# Patient Record
Sex: Female | Born: 1955 | Race: White | Hispanic: No | Marital: Married | Smoking: Never smoker
Health system: Southern US, Community
[De-identification: ages and names within clinical notes are randomized; demographics above are authoritative.]

## PROBLEM LIST (undated history)

## (undated) HISTORY — PX: TONSILECTOMY/ADENOIDECTOMY WITH MYRINGOTOMY: SHX6125

## (undated) HISTORY — PX: TONSILLECTOMY: SUR1361

---

## 1998-06-13 HISTORY — PX: APPENDECTOMY: SHX54

## 2003-06-14 HISTORY — PX: TENNIS ELBOW RELEASE/NIRSCHEL PROCEDURE: SHX6651

## 2017-12-20 DIAGNOSIS — H43812 Vitreous degeneration, left eye: Secondary | ICD-10-CM | POA: Insufficient documentation

## 2018-08-27 ENCOUNTER — Ambulatory Visit: Payer: Self-pay | Admitting: Osteopathic Medicine

## 2018-10-22 ENCOUNTER — Encounter: Payer: Self-pay | Admitting: Osteopathic Medicine

## 2018-10-22 ENCOUNTER — Ambulatory Visit (INDEPENDENT_AMBULATORY_CARE_PROVIDER_SITE_OTHER): Payer: 59 | Admitting: Osteopathic Medicine

## 2018-10-22 VITALS — Temp 96.6°F | Wt 197.0 lb

## 2018-10-22 DIAGNOSIS — Z23 Encounter for immunization: Secondary | ICD-10-CM

## 2018-10-22 DIAGNOSIS — Z1211 Encounter for screening for malignant neoplasm of colon: Secondary | ICD-10-CM

## 2018-10-22 DIAGNOSIS — Z1239 Encounter for other screening for malignant neoplasm of breast: Secondary | ICD-10-CM

## 2018-10-22 DIAGNOSIS — Z7689 Persons encountering health services in other specified circumstances: Secondary | ICD-10-CM | POA: Diagnosis not present

## 2018-10-22 NOTE — Progress Notes (Signed)
Virtual Visit via Video (App used: Doximity) Note  I connected with      Cynthia Dalton on 10/22/18 at 11:10 by a telemedicine application and verified that I am speaking with the correct person using two identifiers.  Patient is at home I am working from home    I discussed the limitations of evaluation and management by telemedicine and the availability of in person appointments. The patient expressed understanding and agreed to proceed.  History of Present Illness: Cynthia Dalton is a 63 y.o. female who would like to discuss  Chief Complaint  Patient presents with  . Establish Care   [place]  In pretty good health, no problems Visit w/ PCP about a year ago Doing a [facility] on alzheimer's given her family history, labs were done Jan/Feb, pt reports no concerns were brought to her attention as far as the blood work, but she does not have records.    Colonoscopy at 49 and 59, planning to f/u in 5 years, next year. Needs Mammo.  No hx abn mammo or Pap      Immunization History  Administered Date(s) Administered  . Influenza,inj,Quad PF,6+ Mos 04/05/2018            Observations/Objective: Temp (!) 96.6 F (35.9 C)   Wt 197 lb (89.4 kg)  BP Readings from Last 3 Encounters:  No data found for BP   Exam: Normal Speech.  NAD  Lab and Radiology Results No results found for this or any previous visit (from the past 72 hour(s)). No results found.     Assessment and Plan: 63 y.o. female with The primary encounter diagnosis was Establishing care with new doctor, encounter for. Diagnoses of Breast cancer screening, Colon cancer screening, and Need for shingles vaccine were also pertinent to this visit.   PDMP not reviewed this encounter.   Orders Placed This Encounter  Procedures  . MM 3D SCREEN BREAST BILATERAL    Order Specific Question:   Reason for Exam (SYMPTOM  OR DIAGNOSIS REQUIRED)    Answer:    screening    Order Specific Question:   [facility] imaging location?    [facility]   No orders of the defined types were placed in this encounter.  There are no Patient Instructions on file for this visit.  Instructions sent via MyChart. If MyChart not available, pt was given option for info via personal e-mail w/ no guarantee of protected health info over unsecured e-mail communication, and MyChart sign-up instructions were included.   Follow Up Instructions: Return in about 6 months (around 04/24/2019) for annual / Pap, sooner if needed .    I discussed the assessment and treatment plan with the patient. The patient was provided an opportunity to ask questions and all were answered. The patient agreed with the plan and demonstrated an understanding of the instructions.   The patient was advised to call back or seek an in-person evaluation if any new concerns, if symptoms worsen or if the condition fails to improve as anticipated.  30 minutes of non-face-to-face time was provided during this encounter.                      Historical information moved to improve visibility of documentation.  History reviewed. No pertinent past medical history. Past Surgical History:  Procedure Laterality Date  . APPENDECTOMY  2000  . TENNIS ELBOW RELEASE/NIRSCHEL PROCEDURE  2005  .  TONSILECTOMY/ADENOIDECTOMY WITH MYRINGOTOMY     Social History   Tobacco Use  . Smoking status: Never Smoker  . Smokeless tobacco: Never Used  Substance Use Topics  . Alcohol use: Yes    Alcohol/week: 5.0 - 8.0 standard drinks    Types: 5 - 8 Standard drinks or equivalent per week   family history includes Alzheimer's disease in her father; Dementia in her mother; High Cholesterol in her mother; High blood pressure in her mother.  Medications: Current Outpatient Medications  Medication Sig Dispense Refill  . ibuprofen (ADVIL) 200 MG tablet Take 200 mg by mouth every 6  (six) hours as needed.     No current facility-administered medications for this visit.    No Known Allergies  PDMP not reviewed this encounter. Orders Placed This Encounter  Procedures  . MM 3D SCREEN BREAST BILATERAL    Order Specific Question:   Reason for Exam (SYMPTOM  OR DIAGNOSIS REQUIRED)    Answer:   screening    Order Specific Question:   [facility]?    [facility]   No orders of the defined types were placed in this encounter.

## 2018-10-29 ENCOUNTER — Ambulatory Visit (INDEPENDENT_AMBULATORY_CARE_PROVIDER_SITE_OTHER): Payer: 59 | Admitting: Family Medicine

## 2018-10-29 VITALS — BP 144/73 | HR 66 | Temp 98.1°F

## 2018-10-29 DIAGNOSIS — Z23 Encounter for immunization: Secondary | ICD-10-CM | POA: Diagnosis not present

## 2018-10-29 NOTE — Progress Notes (Signed)
1st shingrix vaccination given in left Deltoid.  Pt tolerated well and without complications.  Advised to det up appointment for 2 months.

## 2018-11-01 ENCOUNTER — Ambulatory Visit (INDEPENDENT_AMBULATORY_CARE_PROVIDER_SITE_OTHER): Payer: 59

## 2018-11-01 ENCOUNTER — Other Ambulatory Visit: Payer: Self-pay

## 2018-11-01 DIAGNOSIS — Z1239 Encounter for other screening for malignant neoplasm of breast: Secondary | ICD-10-CM | POA: Diagnosis not present

## 2018-12-31 ENCOUNTER — Ambulatory Visit: Payer: 59

## 2019-01-03 ENCOUNTER — Other Ambulatory Visit: Payer: Self-pay

## 2019-01-03 ENCOUNTER — Ambulatory Visit (INDEPENDENT_AMBULATORY_CARE_PROVIDER_SITE_OTHER): Payer: 59 | Admitting: Osteopathic Medicine

## 2019-01-03 VITALS — BP 141/71 | HR 69 | Temp 98.6°F | Wt 199.0 lb

## 2019-01-03 DIAGNOSIS — Z23 Encounter for immunization: Secondary | ICD-10-CM | POA: Diagnosis not present

## 2019-01-03 NOTE — Progress Notes (Signed)
Pt in today for shingrix vaccine. This is 2 of 2 of the shingrix series. Vitals taken and no fever noted. Vaccine was given in left deltoid. Pt tolerated well with no immediate complications.  

## 2019-04-02 ENCOUNTER — Ambulatory Visit (INDEPENDENT_AMBULATORY_CARE_PROVIDER_SITE_OTHER): Payer: Managed Care, Other (non HMO) | Admitting: Osteopathic Medicine

## 2019-04-02 ENCOUNTER — Other Ambulatory Visit: Payer: Self-pay

## 2019-04-02 DIAGNOSIS — Z23 Encounter for immunization: Secondary | ICD-10-CM

## 2019-05-26 ENCOUNTER — Encounter: Payer: Self-pay | Admitting: Emergency Medicine

## 2019-05-26 ENCOUNTER — Emergency Department (INDEPENDENT_AMBULATORY_CARE_PROVIDER_SITE_OTHER)
Admission: EM | Admit: 2019-05-26 | Discharge: 2019-05-26 | Disposition: A | Discharge status: Home / Self Care | Payer: Managed Care, Other (non HMO) | Source: Home / Self Care

## 2019-05-26 ENCOUNTER — Other Ambulatory Visit: Payer: Self-pay

## 2019-05-26 DIAGNOSIS — Z20828 Contact with and (suspected) exposure to other viral communicable diseases: Secondary | ICD-10-CM

## 2019-05-26 DIAGNOSIS — Z20822 Contact with and (suspected) exposure to covid-19: Secondary | ICD-10-CM

## 2019-05-26 NOTE — ED Provider Notes (Signed)
[facility]    CSN: [id number] Arrival date & time: 05/26/19  1019      History   Chief Complaint Chief Complaint  Patient presents with  . COVID test    HPI Cynthia Dalton is a 63 y.o. female.   Patient is requesting a Covid test she is planning to travel to [place] to visit her granddaughter and wants testing prior to traveling.  Patient is currently asymptomatic she denies fever chills cough or congestion she has not had any known Covid exposures  The history is provided by the patient.    History reviewed. No pertinent past medical history.  There are no problems to display for this patient.   Past Surgical History:  Procedure Laterality Date  . APPENDECTOMY  2000  . TENNIS ELBOW RELEASE/NIRSCHEL PROCEDURE  2005  . TONSILECTOMY/ADENOIDECTOMY WITH MYRINGOTOMY      OB History   No obstetric history on file.      Home Medications    Prior to Admission medications   Medication Sig Start Date End Date Taking? Authorizing Provider  ibuprofen (ADVIL) 200 MG tablet Take 200 mg by mouth every 6 (six) hours as needed.    [provider]    Family History Family History  Problem Relation Age of Onset  . High blood pressure Mother   . High Cholesterol Mother   . Dementia Mother   . Alzheimer's disease Father     Social History Social History   Tobacco Use  . Smoking status: Never Smoker  . Smokeless tobacco: Never Used  Substance Use Topics  . Alcohol use: Yes    Alcohol/week: 5.0 - 8.0 standard drinks    Types: 5 - 8 Standard drinks or equivalent per week  . Drug use: Never     Allergies   Patient has no known allergies.   Review of Systems Review of Systems  All other systems reviewed and are negative.    Physical Exam Triage Vital Signs ED Triage Vitals  Enc Vitals Group     BP 05/26/19 1032 131/79     Pulse Rate 05/26/19 1032 83     Resp --      Temp 05/26/19 1032 98.4 F (36.9 C)     Temp Source 05/26/19  1032 Oral     SpO2 05/26/19 1032 98 %     Weight 05/26/19 1034 200 lb (90.7 kg)     Height 05/26/19 1034 5\' 8"  (1.727 m)     Head Circumference --      Peak Flow --      Pain Score 05/26/19 1034 0     Pain Loc --      Pain Edu? --      Excl. in [place]? --    No data found.  Updated Vital Signs BP 131/79 (BP Location: Right Arm)   Pulse 83   Temp 98.4 F (36.9 C) (Oral)   Ht 5\' 8"  (1.727 m)   Wt 90.7 kg   SpO2 98%   BMI 30.41 kg/m   Visual Acuity Right Eye Distance:   Left Eye Distance:   Bilateral Distance:    Right Eye Near:   Left Eye Near:    Bilateral Near:     Physical Exam Vitals reviewed.  Cardiovascular:     Rate and Rhythm: Normal rate.  Pulmonary:     Effort: Pulmonary effort is normal.  Neurological:     Mental Status: She is alert.  Psychiatric:  Mood and Affect: Mood normal.      UC Treatments / Results  Labs (all labs ordered are listed, but only abnormal results are displayed) Labs Reviewed  SARS-COV-2 RNA,(COVID-19) QUALITATIVE NAAT    EKG   Radiology No results found.  Procedures Procedures (including critical care time)  Medications Ordered in UC Medications - No data to display  Initial Impression / Assessment and Plan / UC Course  I have reviewed the triage vital signs and the nursing notes.  Pertinent labs & imaging results that were available during my care of the patient were reviewed by me and considered in my medical decision making (see chart for details).     MDM: [facility] Covid test sent.  Patient is advised to wear her mask and practice social distancing Final Clinical Impressions(s) / UC Diagnoses   Final diagnoses:  Encounter for screening laboratory testing for COVID-19 virus in asymptomatic patient     Discharge Instructions     Your test is pending   ED Prescriptions    None     PDMP not reviewed this encounter.  An After Visit Summary was printed and given to the patient.   Elson Areas,  [place] 05/26/19 1050

## 2019-05-26 NOTE — Discharge Instructions (Signed)
Your test is pending 

## 2019-05-26 NOTE — ED Triage Notes (Signed)
Patient requesting a COVID test for travel, no sx's.

## 2019-05-29 LAB — SARS-COV-2 RNA,(COVID-19) QUALITATIVE NAAT: SARS CoV2 RNA: NOT DETECTED

## 2019-07-13 ENCOUNTER — Ambulatory Visit: Payer: Managed Care, Other (non HMO)

## 2019-07-24 ENCOUNTER — Ambulatory Visit: Payer: Managed Care, Other (non HMO)

## 2019-08-16 LAB — LIPID PANEL
Cholesterol: 200 (ref 0–200)
HDL: 68 (ref 35–70)
LDL Cholesterol: 112
Triglycerides: 113 (ref 40–160)

## 2019-08-16 LAB — HEMOGLOBIN A1C: Hemoglobin A1C: 5

## 2019-09-03 ENCOUNTER — Encounter: Payer: Self-pay | Admitting: Osteopathic Medicine

## 2019-09-16 ENCOUNTER — Encounter: Payer: Self-pay | Admitting: Osteopathic Medicine

## 2019-09-16 ENCOUNTER — Other Ambulatory Visit: Payer: Self-pay

## 2019-09-16 ENCOUNTER — Ambulatory Visit (INDEPENDENT_AMBULATORY_CARE_PROVIDER_SITE_OTHER): Payer: Medicare Other | Admitting: Osteopathic Medicine

## 2019-09-16 VITALS — BP 130/82 | HR 66 | Temp 97.5°F | Ht 68.0 in | Wt 204.4 lb

## 2019-09-16 DIAGNOSIS — L723 Sebaceous cyst: Secondary | ICD-10-CM | POA: Diagnosis not present

## 2019-09-16 MED ORDER — OXYCODONE-ACETAMINOPHEN 5-325 MG PO TABS: 1.0000 | ORAL_TABLET | Freq: Three times a day (TID) | ORAL | 0 refills | Status: DC | PRN

## 2019-09-16 MED ORDER — OXYCODONE-ACETAMINOPHEN 5-325 MG PO TABS: 1.0000 | ORAL_TABLET | Freq: Three times a day (TID) | ORAL | 0 refills | Status: AC | PRN

## 2019-09-16 MED ORDER — NAPROXEN 500 MG PO TABS: 500.0000 mg | ORAL_TABLET | Freq: Two times a day (BID) | ORAL | 1 refills | Status: DC

## 2019-09-16 NOTE — Patient Instructions (Addendum)
Sutured Wound Care Sutures are stitches that can be used to close wounds. Taking care of your wound properly can help to prevent pain and infection. It can also help your wound to heal more quickly. Follow instructions from your health care provider about how to care for your sutured wound. Supplies needed:  Soap and water.  A clean bandage (dressing), if needed.  Antibiotic ointment for first 1-2 days   A clean towel. How to care for your sutured wound  Keep the wound completely dry for the first 24 hours, or for as long as directed by your health care provider. After 24-48 hours, you may shower or bathe as directed by your health care provider. Do not soak or submerge the wound in water until the sutures have been removed.  After the first 24 hours, clean the wound once a day, or as often as directed by your health care provider, using the following steps: ? Wash the wound with soap and water. ? Rinse the wound with water to remove all soap. ? Pat the wound dry with a clean towel. Do not rub the wound.  After cleaning the wound, apply a thin layer of antibiotic ointment as directed by your health care provider. This will prevent infection and keep the dressing from sticking to the wound.  Follow instructions from your health care provider about how to change your dressing: ? Wash your hands with soap and water. If soap and water are not available, use hand sanitizer. ? Change your dressing 2 times per day, or as often as told by your health care provider. If your dressing gets wet or dirty, change it. ? Leave sutures and other skin closures, such as adhesive tape or skin glue, in place. These skin closures may need to stay in place for 2 weeks or longer. If adhesive strip edges start to loosen and curl up, you may trim the loose edges. Do not remove adhesive strips completely unless your health care provider tells you to do that.  Check your wound every day for signs of infection. Watch  for: ? Redness, swelling, or pain. ? Fluid or blood. ? Warmth. ? Pus or a bad smell.  Have the sutures removed as directed by your health care provider. Follow these instructions at home: Medicines  Take or apply over-the-counter and prescription medicines only as told by your health care provider.  If you were prescribed an antibiotic medicine or ointment, take or apply it as told by your health care provider. Do not stop using the antibiotic even if your condition improves. General instructions  To help reduce scarring after your wound heals, cover your wound with clothing or apply sunscreen of at least 30 SPF whenever you are outside.  Do not scratch or pick at your wound.  Avoid stretching your wound.  Raise (elevate) the injured area above the level of your heart while you are sitting or lying down, if possible.  Drink enough fluids to keep your urine clear or pale yellow.  Keep all follow-up visits as told by your health care provider. This is important. Contact a health care provider if:  You received a tetanus shot and you have swelling, severe pain, redness, or bleeding at the injection site.  Your wound breaks open.  You have redness, swelling, or pain around your wound.  You have fluid or blood coming from your wound.  Your wound feels warm to the touch.  You have a fever.  You notice something coming  out of your wound, such as wood or glass.  You have pain that does not get better with medicine.  The skin near your wound changes color.  You need to change your dressing very frequently due to a lot of fluid, blood, or pus draining from the wound.  You develop a new rash.  You develop numbness around the wound. Get help right away if:  You develop severe swelling around your wound.  You have pus or a bad smell coming from your wound.  Your pain suddenly gets worse and is severe.  You develop painful lumps near your wound or anywhere on your  body.  You have a red streak going away from your wound.  The wound is on your hand or foot and: ? You cannot properly move a finger or toe. ? Your fingers or toes look pale or bluish. ? You have numbness that is spreading down your hand, foot, fingers, or toes. Summary  Sutures are stitches that can be used to close wounds.  Taking care of your wound properly can help to prevent pain and infection.  Keep the wound completely dry for the first 24 hours, or for as long as directed by your health care provider. After 24-48 hours, you may shower or bathe as directed by your health care provider. This information is not intended to replace advice given to you by your health care provider. Make sure you discuss any questions you have with your health care provider. Document Revised: 05/12/2017 Document Reviewed: 07/05/2016 Elsevier Patient Education  2020 [employer].

## 2019-09-16 NOTE — Progress Notes (Signed)
Cynthia Dalton is a 64 y.o. female who presents to  [place] at [facility]  today, 09/16/19, seeking care for the following:   Skin cyst      ASSESSMENT & PLAN with other pertinent history/findings:  The encounter diagnosis was Sebaceous cyst.   PRE-OP DIAGNOSIS: Inflamed sebaceous cyst POST-OP DIAGNOSIS: Same  PROCEDURE: incision drainage / removal cyst capsule Performing Physician: Emeterio Reeve   The area surrounding the skin lesion was prepared and draped in the usual sterile manner. Small incision made, sebaceous material expressed, no purulence, cavity explored, removed as much cyst capsule as possible but there was significant scarring. 3 vicryl sutures places subq, 5 monofilament sutures placed for skin closure, hemostasis achieved.     Followup: The patient tolerated the procedure well without complications.  Standard post-procedure care is explained and return precautions are given.    Patient Instructions  Sutured Wound Care Sutures are stitches that can be used to close wounds. Taking care of your wound properly can help to prevent pain and infection. It can also help your wound to heal more quickly. Follow instructions from your health care provider about how to care for your sutured wound. Supplies needed:  Soap and water.  A clean bandage (dressing), if needed.  Antibiotic ointment for first 1-2 days   A clean towel. How to care for your sutured wound  Keep the wound completely dry for the first 24 hours, or for as long as directed by your health care provider. After 24-48 hours, you may shower or bathe as directed by your health care provider. Do not soak or submerge the wound in water until the sutures have been removed.  After the first 24 hours, clean the wound once a day, or as often as directed by your health care provider, using the following steps: ? Wash the wound with soap and water. ? Rinse the  wound with water to remove all soap. ? Pat the wound dry with a clean towel. Do not rub the wound.  After cleaning the wound, apply a thin layer of antibiotic ointment as directed by your health care provider. This will prevent infection and keep the dressing from sticking to the wound.  Follow instructions from your health care provider about how to change your dressing: ? Wash your hands with soap and water. If soap and water are not available, use hand sanitizer. ? Change your dressing 2 times per day, or as often as told by your health care provider. If your dressing gets wet or dirty, change it. ? Leave sutures and other skin closures, such as adhesive tape or skin glue, in place. These skin closures may need to stay in place for 2 weeks or longer. If adhesive strip edges start to loosen and curl up, you may trim the loose edges. Do not remove adhesive strips completely unless your health care provider tells you to do that.  Check your wound every day for signs of infection. Watch for: ? Redness, swelling, or pain. ? Fluid or blood. ? Warmth. ? Pus or a bad smell.  Have the sutures removed as directed by your health care provider. Follow these instructions at home: Medicines  Take or apply over-the-counter and prescription medicines only as told by your health care provider.  If you were prescribed an antibiotic medicine or ointment, take or apply it as told by your health care provider. Do not stop using the antibiotic even if your condition improves. General  instructions  To help reduce scarring after your wound heals, cover your wound with clothing or apply sunscreen of at least 30 SPF whenever you are outside.  Do not scratch or pick at your wound.  Avoid stretching your wound.  Raise (elevate) the injured area above the level of your heart while you are sitting or lying down, if possible.  Drink enough fluids to keep your urine clear or pale yellow.  Keep all follow-up  visits as told by your health care provider. This is important. Contact a health care provider if:  You received a tetanus shot and you have swelling, severe pain, redness, or bleeding at the injection site.  Your wound breaks open.  You have redness, swelling, or pain around your wound.  You have fluid or blood coming from your wound.  Your wound feels warm to the touch.  You have a fever.  You notice something coming out of your wound, such as wood or glass.  You have pain that does not get better with medicine.  The skin near your wound changes color.  You need to change your dressing very frequently due to a lot of fluid, blood, or pus draining from the wound.  You develop a new rash.  You develop numbness around the wound. Get help right away if:  You develop severe swelling around your wound.  You have pus or a bad smell coming from your wound.  Your pain suddenly gets worse and is severe.  You develop painful lumps near your wound or anywhere on your body.  You have a red streak going away from your wound.  The wound is on your hand or foot and: ? You cannot properly move a finger or toe. ? Your fingers or toes look pale or bluish. ? You have numbness that is spreading down your hand, foot, fingers, or toes. Summary  Sutures are stitches that can be used to close wounds.  Taking care of your wound properly can help to prevent pain and infection.  Keep the wound completely dry for the first 24 hours, or for as long as directed by your health care provider. After 24-48 hours, you may shower or bathe as directed by your health care provider. This information is not intended to replace advice given to you by your health care provider. Make sure you discuss any questions you have with your health care provider. Document Revised: 05/12/2017 Document Reviewed: 07/05/2016 Elsevier Patient Education  2020 [employer].      No orders of the defined types were  placed in this encounter.   Meds ordered this encounter  Medications  . naproxen (NAPROSYN) 500 MG tablet    Sig: Take 1 tablet (500 mg total) by mouth 2 (two) times daily with a meal. Take every day for one week, then use as needed after that    Dispense:  60 tablet    Refill:  1  . DISCONTD: oxyCODONE-acetaminophen (PERCOCET/ROXICET) 5-325 MG tablet    Sig: Take 1-2 tablets by mouth every 8 (eight) hours as needed for up to 5 days.    Dispense:  5 tablet    Refill:  0  . oxyCODONE-acetaminophen (PERCOCET/ROXICET) 5-325 MG tablet    Sig: Take 1-2 tablets by mouth every 8 (eight) hours as needed for up to 5 days.    Dispense:  5 tablet    Refill:  0       Follow-up instructions: Return in about 11 days (around 09/27/2019) for suture  removal Dr Lyn Hollingshead - SEE ME SOONER IF NEEDED! .                                         BP 130/82   Pulse 66   Temp (!) 97.5 F (36.4 C) (Oral)   Ht 5\' 8"  (1.727 m)   Wt 204 lb 6.4 oz (92.7 kg)   SpO2 100%   BMI 31.08 kg/m   No outpatient medications have been marked as taking for the 09/16/19 encounter (Office Visit) with 11/16/19, DO.    No results found for this or any previous visit (from the past 72 hour(s)).  No results found.  Depression screen [facility] 2/9 10/22/2018  Decreased Interest 0  Down, Depressed, Hopeless 0  PHQ - 2 Score 0    GAD 7 : Generalized Anxiety Score 10/22/2018  Nervous, Anxious, on Edge 0  Control/stop worrying 0  Worry too much - different things 0  Trouble relaxing 0  Restless 0  Easily annoyed or irritable 0  Afraid - awful might happen 0  Total GAD 7 Score 0      All questions at time of visit were answered - patient instructed to contact office with any additional concerns or updates.  ER/RTC precautions were reviewed with the patient.  Please note: voice recognition software was used to produce this document, and typos may escape review. Please contact  Dr. 12/22/2018 for any needed clarifications.   Total encounter time: 40 minutes.

## 2019-09-27 ENCOUNTER — Encounter: Payer: Self-pay | Admitting: Osteopathic Medicine

## 2019-09-27 ENCOUNTER — Other Ambulatory Visit: Payer: Self-pay

## 2019-09-27 ENCOUNTER — Ambulatory Visit (INDEPENDENT_AMBULATORY_CARE_PROVIDER_SITE_OTHER): Payer: Medicare Other | Admitting: Osteopathic Medicine

## 2019-09-27 VITALS — Wt 203.0 lb

## 2019-09-27 DIAGNOSIS — Z4802 Encounter for removal of sutures: Secondary | ICD-10-CM

## 2019-09-27 NOTE — Progress Notes (Signed)
Suture removed x5 Wound healing appropriately  No concerns Still some swelling/insuration ?scarring Monitor  Recheck prn

## 2019-12-06 ENCOUNTER — Other Ambulatory Visit: Payer: Self-pay | Admitting: Osteopathic Medicine

## 2019-12-06 DIAGNOSIS — Z1231 Encounter for screening mammogram for malignant neoplasm of breast: Secondary | ICD-10-CM

## 2019-12-11 ENCOUNTER — Other Ambulatory Visit: Payer: Self-pay

## 2019-12-11 ENCOUNTER — Ambulatory Visit (INDEPENDENT_AMBULATORY_CARE_PROVIDER_SITE_OTHER): Payer: Medicare Other

## 2019-12-11 DIAGNOSIS — Z1231 Encounter for screening mammogram for malignant neoplasm of breast: Secondary | ICD-10-CM

## 2019-12-30 ENCOUNTER — Encounter: Payer: Medicare Other | Admitting: Obstetrics and Gynecology

## 2020-01-20 ENCOUNTER — Other Ambulatory Visit: Payer: Self-pay

## 2020-01-20 ENCOUNTER — Ambulatory Visit (INDEPENDENT_AMBULATORY_CARE_PROVIDER_SITE_OTHER): Payer: Medicare Other | Admitting: Obstetrics and Gynecology

## 2020-01-20 ENCOUNTER — Other Ambulatory Visit (HOSPITAL_COMMUNITY)
Admission: RE | Admit: 2020-01-20 | Discharge: 2020-01-20 | Disposition: A | Discharge status: Home / Self Care | Payer: Medicare Other | Source: Ambulatory Visit | Attending: Obstetrics and Gynecology | Admitting: Obstetrics and Gynecology

## 2020-01-20 ENCOUNTER — Encounter: Payer: Self-pay | Admitting: Obstetrics and Gynecology

## 2020-01-20 VITALS — BP 138/73 | HR 91 | Resp 16 | Ht 68.0 in | Wt 207.0 lb

## 2020-01-20 DIAGNOSIS — N9089 Other specified noninflammatory disorders of vulva and perineum: Secondary | ICD-10-CM | POA: Diagnosis not present

## 2020-01-20 DIAGNOSIS — Z01419 Encounter for gynecological examination (general) (routine) without abnormal findings: Secondary | ICD-10-CM | POA: Insufficient documentation

## 2020-01-20 DIAGNOSIS — Z124 Encounter for screening for malignant neoplasm of cervix: Secondary | ICD-10-CM | POA: Diagnosis not present

## 2020-01-20 DIAGNOSIS — Z1151 Encounter for screening for human papillomavirus (HPV): Secondary | ICD-10-CM | POA: Diagnosis not present

## 2020-01-20 DIAGNOSIS — Z1231 Encounter for screening mammogram for malignant neoplasm of breast: Secondary | ICD-10-CM

## 2020-01-20 NOTE — Progress Notes (Signed)
GYNECOLOGY ANNUAL PREVENTATIVE CARE ENCOUNTER NOTE  Subjective:   Cynthia Dalton is a 64 y.o. [id number] female here for a annual gynecologic exam. Current complaints: vulvar itching/irritation for hte last several months. Has improved with cortaid for short periods of time but comes back.    Denies abnormal vaginal bleeding, discharge, pelvic pain, problems with intercourse or other gynecologic concerns.    Gynecologic History No LMP recorded. Patient is postmenopausal. Contraception: post menopausal status Last Pap: 4 years ago. Results: normal Last mammogram: 12/2019. Results: Bir[facility] 1 DEXA: had with in last two years, pt reports normal  Obstetric History OB History  Gravida Para Term Preterm AB Living  3 2 2   1 2   SAB TAB Ectopic Multiple Live Births  1       2    # Outcome Date GA Lbr Len/2nd Weight Sex Delivery Anes PTL Lv  3 SAB           2 Term           1 Term             No past medical history on file.  Past Surgical History:  Procedure Laterality Date  . APPENDECTOMY  2000  . TENNIS ELBOW RELEASE/NIRSCHEL PROCEDURE  2005  . TONSILECTOMY/ADENOIDECTOMY WITH MYRINGOTOMY      Current Outpatient Medications on File Prior to Visit  Medication Sig Dispense Refill  . ibuprofen (ADVIL) 200 MG tablet Take 200 mg by mouth every 6 (six) hours as needed.    . naproxen (NAPROSYN) 500 MG tablet Take 1 tablet (500 mg total) by mouth 2 (two) times daily with a meal. Take every day for one week, then use as needed after that 60 tablet 1   No current facility-administered medications on file prior to visit.    No Known Allergies  Social History   Socioeconomic History  . Marital status: Married    Spouse name: 2006  . Number of children: 2  . Years of education: Not on file  . Highest education level: Not on file  Occupational History  . Occupation: retired  Tobacco Use  . Smoking status: Never Smoker  . Smokeless tobacco: Never Used  Vaping Use  . Vaping  Use: Never used  Substance and Sexual Activity  . Alcohol use: Yes    Alcohol/week: 5.0 - 8.0 standard drinks    Types: 5 - 8 Standard drinks or equivalent per week  . Drug use: Never  . Sexual activity: Yes    Partners: Male    Birth control/protection: Post-menopausal, None  Other Topics Concern  . Not on file  Social History Narrative  . Not on file   Social Determinants of Health   Financial Resource Strain:   . Difficulty of Paying Living Expenses:   Food Insecurity:   . Worried About Chrissie Noa in the Last Year:   . Programme researcher, broadcasting/film/video in the Last Year:   Transportation Needs:   . Barista (Medical):   Freight forwarder Lack of Transportation (Non-Medical):   Physical Activity:   . Days of Exercise per Week:   . Minutes of Exercise per Session:   Stress:   . Feeling of Stress :   Social Connections:   . Frequency of Communication with Friends and Family:   . Frequency of Social Gatherings with Friends and Family:   . Attends Religious Services:   . Active Member of Clubs or Organizations:   . Attends Club  or Organization Meetings:   Marland Kitchen Marital Status:   Intimate Partner Violence:   . Fear of Current or Ex-Partner:   . Emotionally Abused:   Marland Kitchen Physically Abused:   . Sexually Abused:     Family History  Problem Relation Age of Onset  . High blood pressure Mother   . High Cholesterol Mother   . Dementia Mother   . Alzheimer's disease Father    The following portions of the patient's history were reviewed and updated as appropriate: allergies, current medications, past family history, past medical history, past social history, past surgical history and problem list.  Review of Systems Pertinent items are noted in HPI.   Objective:  BP 138/73   Pulse 91   Resp 16   Ht 5\' 8"  (1.727 m)   Wt 207 lb (93.9 kg)   BMI 31.47 kg/m  CONSTITUTIONAL: Well-developed, well-nourished female in no acute distress.  HENT:  Normocephalic, atraumatic, External right and  left ear normal. Oropharynx is clear and moist EYES: Conjunctivae and EOM are normal. Pupils are equal, round, and reactive to light. No scleral icterus.  NECK: Normal range of motion, supple, no masses.  Normal thyroid.  SKIN: Skin is warm and dry. No rash noted. Not diaphoretic. No erythema. No pallor. NEUROLOGIC: Alert and oriented to person, place, and time. Normal reflexes, muscle tone coordination. No cranial nerve deficit noted. PSYCHIATRIC: Normal mood and affect. Normal behavior. Normal judgment and thought content. CARDIOVASCULAR: Normal heart rate noted RESPIRATORY: Effort normal, no problems with respiration noted. BREASTS: Symmetric in size. No masses, skin changes, nipple drainage, or lymphadenopathy. ABDOMEN: Soft, no distention noted.  No tenderness, rebound or guarding.  PELVIC: Normal appearing external genitalia with 1 cm area at posterior fourchette of mild ulceration and erythema, normal appearing vaginal mucosa and cervix.  No abnormal discharge noted.  Pap smear obtained. Normal uterine size, no other palpable masses, no uterine or adnexal tenderness. MUSCULOSKELETAL: Normal range of motion. No tenderness.  No cyanosis, clubbing, or edema. 2+ distal pulses.  Exam done with chaperone present.  FRAX score done 01/20/20: n/a  Assessment and Plan:   1. Well woman exam See below  2. Cervical cancer screening - Cytology - PAP( [place])  3. Encounter for screening mammogram for malignant neoplasm of breast Up to date  4. Vulvar irritation  5. Vulvar lesion Return for vulvar biopsy Further management based on results   Will follow up results of pap smear and manage accordingly. Encouraged improvement in diet and exercise.  Mammogram UTD Referral for colonoscopy UTD DEXA UTD  Routine preventative health maintenance measures emphasized. Please refer to After Visit Summary for other counseling recommendations.   Total face-to-face time with patient: 30  minutes. Over 50% of encounter was spent on counseling and coordination of care.   03/21/20, M.D. Attending Center for [facility] [employer])

## 2020-01-21 LAB — CYTOLOGY - PAP
Comment: NEGATIVE
Diagnosis: NEGATIVE
High risk HPV: NEGATIVE

## 2020-01-27 ENCOUNTER — Ambulatory Visit (INDEPENDENT_AMBULATORY_CARE_PROVIDER_SITE_OTHER): Payer: Medicare Other | Admitting: Obstetrics and Gynecology

## 2020-01-27 ENCOUNTER — Other Ambulatory Visit: Payer: Self-pay

## 2020-01-27 ENCOUNTER — Other Ambulatory Visit: Payer: Self-pay | Admitting: Obstetrics and Gynecology

## 2020-01-27 ENCOUNTER — Encounter: Payer: Self-pay | Admitting: Obstetrics and Gynecology

## 2020-01-27 VITALS — BP 131/71 | HR 69 | Resp 16 | Ht 68.0 in | Wt 207.0 lb

## 2020-01-27 DIAGNOSIS — N9089 Other specified noninflammatory disorders of vulva and perineum: Secondary | ICD-10-CM

## 2020-01-27 NOTE — Progress Notes (Signed)
   VULVAR BIOPSY NOTE  Cynthia Dalton [id number]  01/27/2020  Indication for biopsy: vulvar itching, erythema and irritation  The indications for vulvar biopsy were reviewed. Risks of the biopsy including pain, bleeding, infection, inadequate specimen, and need for additional procedures were discussed. The patient stated understanding and agreed to undergo procedure today. Consent was signed, and an adequate time out performed.   The patient's vulva was prepped with Betadine. 1% lidocaine was injected into the posterior fourchette, bilaterally. A 3-mm punch biopsy was done at the right aspect, biopsy tissue was picked up with sterile forceps and sterile scissors were used to excise the lesion. the same thing was repeated on the left aspect. Small bleeding was noted and hemostasis was achieved using silver nitrate sticks.    The patient tolerated the procedure well.   Specimens: 1. Right perineum 2. Left perineum  Post-procedure instructions were given to the patient. The patient is to call with heavy bleeding, fever greater than 100.4, foul smelling vaginal discharge or other concerns. The patient will be return to clinic in two weeks for discussion of results.   Baldemar Lenis, M.D. Attending Obstetrician & Gynecologist, [facility] for [employer], [facility]

## 2020-02-03 MED ORDER — TRIAMCINOLONE ACETONIDE 0.025 % EX OINT: 1.0000 "application " | TOPICAL_OINTMENT | Freq: Every evening | CUTANEOUS | 0 refills | Status: DC

## 2020-02-03 NOTE — Addendum Note (Signed)
Addended by: Leroy Libman on: 02/03/2020 02:53 PM   Modules accepted: Orders

## 2020-03-12 ENCOUNTER — Ambulatory Visit (INDEPENDENT_AMBULATORY_CARE_PROVIDER_SITE_OTHER): Payer: Medicare Other | Admitting: Obstetrics and Gynecology

## 2020-03-12 ENCOUNTER — Other Ambulatory Visit: Payer: Self-pay

## 2020-03-12 ENCOUNTER — Encounter: Payer: Self-pay | Admitting: Obstetrics and Gynecology

## 2020-03-12 VITALS — BP 117/72 | HR 65 | Resp 16 | Ht 68.0 in | Wt 209.0 lb

## 2020-03-12 DIAGNOSIS — Z9889 Other specified postprocedural states: Secondary | ICD-10-CM

## 2020-03-12 DIAGNOSIS — L900 Lichen sclerosus et atrophicus: Secondary | ICD-10-CM | POA: Diagnosis not present

## 2020-03-12 NOTE — Progress Notes (Signed)
   GYNECOLOGY OFFICE FOLLOW UP NOTE  History:  64 y.o. [id number] here today for follow up for vulvar biopsies, vulvar itching. She is doing well. Biopsies hurt for a day or so and then healed well. Used cream for 3 weeks and itchign resolved.    History reviewed. No pertinent past medical history.  Past Surgical History:  Procedure Laterality Date  . APPENDECTOMY  2000  . TENNIS ELBOW RELEASE/NIRSCHEL PROCEDURE  2005  . TONSILECTOMY/ADENOIDECTOMY WITH MYRINGOTOMY       Current Outpatient Medications:  .  ibuprofen (ADVIL) 200 MG tablet, Take 200 mg by mouth every 6 (six) hours as needed., Disp: , Rfl:  .  triamcinolone (KENALOG) 0.025 % ointment, Apply 1 application topically at bedtime. Pea-sized amount., Disp: 30 g, Rfl: 0  The following portions of the patient's history were reviewed and updated as appropriate: allergies, current medications, past family history, past medical history, past social history, past surgical history and problem list.   Review of Systems:  Pertinent items noted in HPI and remainder of comprehensive ROS otherwise negative.   Objective:  Physical Exam BP 117/72   Pulse 65   Resp 16   Ht 5\' 8"  (1.727 m)   Wt 209 lb (94.8 kg)   BMI 31.78 kg/m  CONSTITUTIONAL: Well-developed, well-nourished female in no acute distress.  HENT:  Normocephalic, atraumatic. External right and left ear normal. Oropharynx is clear and moist EYES: Conjunctivae and EOM are normal. Pupils are equal, round, and reactive to light. No scleral icterus.  NECK: Normal range of motion, supple, no masses SKIN: Skin is warm and dry. No rash noted. Not diaphoretic. No erythema. No pallor. NEUROLOGIC: Alert and oriented to person, place, and time. Normal reflexes, muscle tone coordination. No cranial nerve deficit noted. PSYCHIATRIC: Normal mood and affect. Normal behavior. Normal judgment and thought content. CARDIOVASCULAR: Normal heart rate noted RESPIRATORY: Effort normal, no problems  with respiration noted ABDOMEN: Soft, no distention noted.   PELVIC: Normal appearing external genitalia; well healed vulva, minor hypopigmentation at perineum MUSCULOSKELETAL: Normal range of motion. No edema noted.  Exam done with chaperone present.  Labs and Imaging No results found.  Assessment & Plan:   1. Lichen sclerosus - Reviewed path of lichen sclerosus vs contct dermatitis, favor LS since it has not recurred - reviewed etiology/expected course of LS, need for treatment, risks of SCC, progression if not treated - cont clobetasol weekly for now - return 3 months  2. S/P biopsy Well healed   Routine preventative health maintenance measures emphasized. Please refer to After Visit Summary for other counseling recommendations.   Return in about 3 months (around 06/11/2020) for Followup, in person.  Total face-to-face time with patient: 16 minutes. Over 50% of encounter was spent on counseling and coordination of care.  06/13/2020, M.D. Attending Center for [facility] [employer])

## 2020-03-17 LAB — LIPID PANEL
Cholesterol: 213 — AB (ref 0–200)
HDL: 64 (ref 35–70)
LDL Cholesterol: 135
Triglycerides: 78 (ref 40–160)

## 2020-03-17 LAB — HEMOGLOBIN A1C: Hemoglobin A1C: 5.3

## 2020-04-23 ENCOUNTER — Telehealth: Payer: Self-pay | Admitting: *Deleted

## 2020-04-23 NOTE — Telephone Encounter (Signed)
Left patient a message to call and schedule 3 month F/U with Dr. Earlene Plater in January.

## 2020-06-29 ENCOUNTER — Ambulatory Visit: Payer: Medicare Other | Admitting: Obstetrics and Gynecology

## 2020-07-09 ENCOUNTER — Ambulatory Visit: Payer: Medicare Other | Admitting: Obstetrics and Gynecology

## 2020-07-16 ENCOUNTER — Encounter: Payer: Self-pay | Admitting: Obstetrics and Gynecology

## 2020-07-16 ENCOUNTER — Ambulatory Visit (INDEPENDENT_AMBULATORY_CARE_PROVIDER_SITE_OTHER): Payer: Medicare Other | Admitting: Obstetrics and Gynecology

## 2020-07-16 ENCOUNTER — Other Ambulatory Visit: Payer: Self-pay

## 2020-07-16 VITALS — BP 124/72 | HR 74 | Resp 16 | Ht 68.0 in | Wt 213.0 lb

## 2020-07-16 DIAGNOSIS — L900 Lichen sclerosus et atrophicus: Secondary | ICD-10-CM | POA: Diagnosis not present

## 2020-07-16 MED ORDER — CLOBETASOL PROPIONATE 0.05 % EX OINT: TOPICAL_OINTMENT | CUTANEOUS | 5 refills | Status: DC

## 2020-07-16 NOTE — Progress Notes (Signed)
   GYNECOLOGY OFFICE FOLLOW UP NOTE  History:  65 y.o. [id number] here today for follow up for lichen sclerosis. Using clobetasol 2x weekly and having good response. No further itching or other issues. Overall, doing well.    History reviewed. No pertinent past medical history.  Past Surgical History:  Procedure Laterality Date  . APPENDECTOMY  2000  . TENNIS ELBOW RELEASE/NIRSCHEL PROCEDURE  2005  . TONSILECTOMY/ADENOIDECTOMY WITH MYRINGOTOMY       Current Outpatient Medications:  .  clobetasol ointment (TEMOVATE) 0.05 %, Apply to affected area 2 x weekly., Disp: 30 g, Rfl: 5 .  ibuprofen (ADVIL) 200 MG tablet, Take 200 mg by mouth every 6 (six) hours as needed., Disp: , Rfl:   The following portions of the patient's history were reviewed and updated as appropriate: allergies, current medications, past family history, past medical history, past social history, past surgical history and problem list.   Review of Systems:  Pertinent items noted in HPI and remainder of comprehensive ROS otherwise negative.   Objective:  Physical Exam BP 124/72   Pulse 74   Resp 16   Ht 5\' 8"  (1.727 m)   Wt 213 lb (96.6 kg)   BMI 32.39 kg/m  CONSTITUTIONAL: Well-developed, well-nourished female in no acute distress.  HENT:  Normocephalic, atraumatic. External right and left ear normal. Oropharynx is clear and moist EYES: Conjunctivae and EOM are normal. Pupils are equal, round, and reactive to light. No scleral icterus.  NECK: Normal range of motion, supple, no masses SKIN: Skin is warm and dry. No rash noted. Not diaphoretic. No erythema. No pallor. NEUROLOGIC: Alert and oriented to person, place, and time. Normal reflexes, muscle tone coordination. No cranial nerve deficit noted. PSYCHIATRIC: Normal mood and affect. Normal behavior. Normal judgment and thought content. CARDIOVASCULAR: Normal heart rate noted RESPIRATORY: Effort normal, no problems with respiration noted ABDOMEN: Soft, no  distention noted.   PELVIC: Normal appearing and mildly atrophic external genitalia; thin white line noted at labia majora fold on left MUSCULOSKELETAL: Normal range of motion. No edema noted.  Exam done with chaperone present.  Labs and Imaging No results found.  Assessment & Plan:  1. Lichen sclerosus Cont clobetalol 2x weekly May decrease to 1x weekly if tolerated Refill sent today for clobetasol   Routine preventative health maintenance measures emphasized. Please refer to After Visit Summary for other counseling recommendations.   Return in about 6 months (around 01/13/2021).  Total face-to-face time with patient: 12 minutes. Over 50% of encounter was spent on counseling and coordination of care.  03/15/2021, MD, [facility] Attending Center for [facility])

## 2020-08-31 LAB — LIPID PANEL
Cholesterol: 212 — AB (ref 0–200)
HDL: 58 (ref 35–70)
LDL Cholesterol: 137
Triglycerides: 96 (ref 40–160)

## 2020-08-31 LAB — HEMOGLOBIN A1C: Hemoglobin A1C: 5.2

## 2020-09-01 ENCOUNTER — Encounter: Payer: Self-pay | Admitting: Osteopathic Medicine

## 2020-09-09 ENCOUNTER — Encounter: Payer: Self-pay | Admitting: Osteopathic Medicine

## 2020-11-02 ENCOUNTER — Encounter: Payer: Self-pay | Admitting: Osteopathic Medicine

## 2020-11-02 NOTE — Telephone Encounter (Signed)
Please schedule for annual physical to dicsuss preventive care issues

## 2020-11-02 NOTE — Telephone Encounter (Signed)
(  I know she's Medicare A/B can just put her on schedule for MWV w/ me or [place])

## 2020-11-03 ENCOUNTER — Ambulatory Visit (INDEPENDENT_AMBULATORY_CARE_PROVIDER_SITE_OTHER): Payer: Medicare Other | Admitting: Osteopathic Medicine

## 2020-11-03 DIAGNOSIS — Z0000 Encounter for general adult medical examination without abnormal findings: Secondary | ICD-10-CM | POA: Diagnosis not present

## 2020-11-03 DIAGNOSIS — Z780 Asymptomatic menopausal state: Secondary | ICD-10-CM

## 2020-11-03 DIAGNOSIS — Z1231 Encounter for screening mammogram for malignant neoplasm of breast: Secondary | ICD-10-CM | POA: Diagnosis not present

## 2020-11-03 DIAGNOSIS — Z1211 Encounter for screening for malignant neoplasm of colon: Secondary | ICD-10-CM | POA: Diagnosis not present

## 2020-11-03 NOTE — Progress Notes (Signed)
MEDICARE ANNUAL WELLNESS VISIT  11/03/2020  Telephone Visit Disclaimer This Medicare AWV was conducted by telephone due to national recommendations for restrictions regarding the COVID-19 Pandemic (e.g. social distancing).  I verified, using two identifiers, that I am speaking with Cynthia Dalton or their authorized healthcare agent. I discussed the limitations, risks, security, and privacy concerns of performing an evaluation and management service by telephone and the potential availability of an in-person appointment in the future. The patient expressed understanding and agreed to proceed.  Location of Patient: Home Location of Provider (nurse):  In the office.  Subjective:    Cynthia Dalton is a 65 y.o. female patient of Sunnie Nielsen, DO who had a Medicare Annual Wellness Visit today via telephone. Cynthia Dalton is Retired and lives with their spouse. she has 2 children. she reports that she is socially active and does interact with friends/family regularly. she is moderately physically active and enjoys outdoor activities, reading, cooking, walking and doing puzzles.  Patient Care Team: Sunnie Nielsen, DO as PCP - General (Osteopathic Medicine)  Advanced Directives 11/03/2020  Does Patient Have a Medical Advance Directive? Yes  Type of Estate agent of [place];Living will  Does patient want to make changes to medical advance directive? No - Patient declined  Copy of Healthcare Power of Attorney in Chart? No - copy requested    Hospital Utilization Over the Past 12 Months: # of hospitalizations or ER visits: 0 # of surgeries: 0  Review of Systems    Patient reports that her overall health is better compared to last year.  History obtained from chart review and the patient  Patient Reported Readings (BP, Pulse, CBG, Weight, etc) none  Pain Assessment Pain : No/denies pain     Current Medications & Allergies (verified) Allergies as of  11/03/2020   No Known Allergies     Medication List       Accurate as of Nov 03, 2020  2:11 PM. If you have any questions, ask your nurse or doctor.        clobetasol ointment 0.05 % Commonly known as: TEMOVATE Apply to affected area 2 x weekly.   ibuprofen 200 MG tablet Commonly known as: ADVIL Take 200 mg by mouth every 6 (six) hours as needed.       History (reviewed): History reviewed. No pertinent past medical history. Past Surgical History:  Procedure Laterality Date  . APPENDECTOMY  2000  . TENNIS ELBOW RELEASE/NIRSCHEL PROCEDURE  2005  . TONSILECTOMY/ADENOIDECTOMY WITH MYRINGOTOMY     Family History  Problem Relation Age of Onset  . High blood pressure Mother   . High Cholesterol Mother   . Dementia Mother   . Alzheimer's disease Father    Social History   Socioeconomic History  . Marital status: Married    Spouse name: Chrissie Noa  . Number of children: 2  . Years of education: 16  . Highest education level: Bachelor's degree (e.g., BA, AB, BS)  Occupational History  . Occupation: retired  Tobacco Use  . Smoking status: Never Smoker  . Smokeless tobacco: Never Used  Vaping Use  . Vaping Use: Never used  Substance and Sexual Activity  . Alcohol use: Yes    Alcohol/week: 8.0 standard drinks    Types: 8 Standard drinks or equivalent per week  . Drug use: Never  . Sexual activity: Yes    Partners: Male    Birth control/protection: Post-menopausal, None  Other Topics Concern  . Not on file  Social  History Narrative   Lives with her husband. She has two children. She likes to read, cook, doing outdoor activities, and doing crossword puzzles.   Social Determinants of Health   Financial Resource Strain: Low Risk   . Difficulty of Paying Living Expenses: Not hard at all  Food Insecurity: No Food Insecurity  . Worried About Programme researcher, broadcasting/film/videounning Out of Food in the Last Year: Never true  . Ran Out of Food in the Last Year: Never true  Transportation Needs: No  Transportation Needs  . Lack of Transportation (Medical): No  . Lack of Transportation (Non-Medical): No  Physical Activity: Insufficiently Active  . Days of Exercise per Week: 2 days  . Minutes of Exercise per Session: 30 min  Stress: No Stress Concern Present  . Feeling of Stress : Not at all  Social Connections: Socially Integrated  . Frequency of Communication with Friends and Family: More than three times a week  . Frequency of Social Gatherings with Friends and Family: Twice a week  . Attends Religious Services: More than 4 times per year  . Active Member of Clubs or Organizations: Yes  . Attends BankerClub or Organization Meetings: More than 4 times per year  . Marital Status: Married    Activities of Daily Living In your present state of health, do you have any difficulty performing the following activities: 11/03/2020  Hearing? N  Vision? N  Difficulty concentrating or making decisions? N  Walking or climbing stairs? N  Dressing or bathing? N  Doing errands, shopping? N  Preparing Food and eating ? N  Using the Toilet? N  In the past six months, have you accidently leaked urine? N  Do you have problems with loss of bowel control? N  Managing your Medications? N  Managing your Finances? N  Housekeeping or managing your Housekeeping? N  Some recent data might be hidden    Patient Education/ Literacy How often do you need to have someone help you when you read instructions, pamphlets, or other written materials from your doctor or pharmacy?: 1 - Never What is the last grade level you completed in school?: Bachelor's degree  Exercise Current Exercise Habits: Home exercise routine;Structured exercise class, Type of exercise: strength training/weights;stretching;walking, Time (Minutes): 45, Frequency (Times/Week): >7, Weekly Exercise (Minutes/Week): 0, Intensity: Moderate  Diet Patient reports consuming 3 meals a day and 1-2 snack(s) a day Patient reports that her primary diet  is: Regular Patient reports that she does have regular access to food.   Depression Screen PHQ 2/9 Scores 11/03/2020 10/22/2018  PHQ - 2 Score 0 0     Fall Risk Fall Risk  11/03/2020  Falls in the past year? 0  Number falls in past yr: 0  Injury with Fall? 0  Risk for fall due to : No Fall Risks  Follow up Falls evaluation completed     Objective:  Cynthia DenverBarbara Dalton seemed alert and oriented and she participated appropriately during our telephone visit.  Blood Pressure Weight BMI  BP Readings from Last 3 Encounters:  07/16/20 124/72  03/12/20 117/72  01/27/20 131/71   Wt Readings from Last 3 Encounters:  07/16/20 213 lb (96.6 kg)  03/12/20 209 lb (94.8 kg)  01/27/20 207 lb (93.9 kg)   BMI Readings from Last 1 Encounters:  07/16/20 32.39 kg/m    *Unable to obtain current vital signs, weight, and BMI due to telephone visit type  Hearing/Vision  . Cynthia MccreedyBarbara did not seem to have difficulty with hearing/understanding during the telephone  conversation . Reports that she has not had a formal eye exam by an eye care professional within the past year . Reports that she has not had a formal hearing evaluation within the past year *Unable to fully assess hearing and vision during telephone visit type  Cognitive Function: 6CIT Screen 11/03/2020  What Year? 0 points  What month? 0 points  What time? 0 points  Count back from 20 0 points  Months in reverse 0 points  Repeat phrase 0 points  Total Score 0   (Normal:0-7, Significant for Dysfunction: >8)  Normal Cognitive Function Screening: Yes   Immunization & Health Maintenance Record Immunization History  Administered Date(s) Administered  . Influenza,inj,Quad PF,6+ Mos 04/05/2018, 04/02/2019  . Influenza-Unspecified 03/24/2020  . PFIZER(Purple Top)SARS-COV-2 Vaccination 07/15/2019, 08/11/2019, 03/13/2020, 11/02/2020  . Zoster Recombinat (Shingrix) 10/29/2018, 01/03/2019    Health Maintenance  Topic Date Due  .  COLONOSCOPY (Pts 45-61yrs Insurance coverage will need to be confirmed)  11/03/2021 (Originally 06/18/1999)  . TETANUS/TDAP  11/03/2021 (Originally 06/17/1973)  . Hepatitis C Screening  11/03/2021 (Originally 06/17/1972)  . PNA vac Low Risk Adult (1 of 2 - PCV13) 11/03/2021 (Originally 06/18/2019)  . INFLUENZA VACCINE  01/11/2021  . MAMMOGRAM  12/10/2021  . DEXA SCAN  Completed  . COVID-19 Vaccine  Completed  . HPV VACCINES  Aged Out       Assessment  This is a routine wellness examination for [facility].  Health Maintenance: Due or Overdue There are no preventive care reminders to display for this patient.  Linnaea Ahn does not need a referral for Community Assistance: Care Management:   no Social Work:    no Prescription Assistance:  no Nutrition/Diabetes Education:  no   Plan:  Personalized Goals Goals Addressed              This Visit's Progress   .  Patient Stated (pt-stated)        Patient stated that she would like to loose about 10 lbs.      Personalized Health Maintenance & Screening Recommendations  Pneumococcal vaccine  Td vaccine Screening mammography Bone densitometry screening Colorectal cancer screening  Lung Cancer Screening Recommended: no (Low Dose CT Chest recommended if Age 35-80 years, 30 pack-year currently smoking OR have quit w/in past 15 years) Hepatitis C Screening recommended: yes HIV Screening recommended: yes  Advanced Directives: Written information was not prepared per patient's request.  Referrals & Orders Orders Placed This Encounter  Procedures  . Mammogram 3D SCREEN BREAST BILATERAL  . DEXAScan  . Ambulatory referral to Gastroenterology (for Colonoscopy)    Follow-up Plan . Follow-up with Sunnie Nielsen, DO as planned . Schedule your nurse visit for your pneumonia vacccine.  . Schedule your appointment at the pharmacy for your tetanus shot.  . Referral for your colonoscopy, bone density and mammogram has been  sent.  . Medicare wellness visit in one year.   I have personally reviewed and noted the following in the patient's chart:   . Medical and social history . Use of alcohol, tobacco or illicit drugs  . Current medications and supplements . Functional ability and status . Nutritional status . Physical activity . Advanced directives . List of other physicians . Hospitalizations, surgeries, and ER visits in previous 12 months . Vitals . Screenings to include cognitive, depression, and falls . Referrals and appointments  In addition, I have reviewed and discussed with Cynthia Dalton certain preventive protocols, quality metrics, and best practice recommendations. A written personalized care  plan for preventive services as well as general preventive health recommendations is available and can be mailed to the patient at her request.      Modesto Charon, RN  11/03/2020

## 2020-11-03 NOTE — Patient Instructions (Addendum)
  MEDICARE ANNUAL WELLNESS VISIT Health Maintenance Summary and Written Plan of Care  Ms. Keahey ,  Thank you for allowing me to perform your Medicare Annual Wellness Visit and for your ongoing commitment to your health.   Health Maintenance & Immunization History Health Maintenance  Topic Date Due  . COLONOSCOPY (Pts 45-97yrs Insurance coverage will need to be confirmed)  11/03/2021 (Originally 06/18/1999)  . TETANUS/TDAP  11/03/2021 (Originally 06/17/1973)  . Hepatitis C Screening  11/03/2021 (Originally 06/17/1972)  . PNA vac Low Risk Adult (1 of 2 - PCV13) 11/03/2021 (Originally 06/18/2019)  . INFLUENZA VACCINE  01/11/2021  . MAMMOGRAM  12/10/2021  . DEXA SCAN  Completed  . COVID-19 Vaccine  Completed  . HPV VACCINES  Aged Out   Immunization History  Administered Date(s) Administered  . Influenza,inj,Quad PF,6+ Mos 04/05/2018, 04/02/2019  . Influenza-Unspecified 03/24/2020  . PFIZER(Purple Top)SARS-COV-2 Vaccination 07/15/2019, 08/11/2019, 03/13/2020, 11/02/2020  . Zoster Recombinat (Shingrix) 10/29/2018, 01/03/2019    These are the patient goals that we discussed: Goals Addressed              This Visit's Progress   .  Patient Stated (pt-stated)        Patient stated that she would like to loose about 10 lbs.        This is a list of Health Maintenance Items that are overdue or due now: Pneumococcal vaccine  Td vaccine Screening mammography Bone densitometry screening Colorectal cancer screening  Orders/Referrals Placed Today: Orders Placed This Encounter  Procedures  . Mammogram 3D SCREEN BREAST BILATERAL    Standing Status:   Future    Standing Expiration Date:   11/03/2021    Scheduling Instructions:     Please call patient to schedule.    Order Specific Question:   Reason for Exam (SYMPTOM  OR DIAGNOSIS REQUIRED)    Answer:   Breast cancer screening    Order Specific Question:   Preferred imaging location?    Answer:   [facility]  . DEXAScan     Standing Status:   Future    Standing Expiration Date:   11/03/2021    Scheduling Instructions:     Please call patient to schedule.    Order Specific Question:   Reason for exam:    Answer:   Post Menopausal    Order Specific Question:   Preferred imaging location?    Answer:   [facility]  . Ambulatory referral to Gastroenterology (for Colonoscopy)    Referral Priority:   Routine    Referral Type:   Consultation    Referral Reason:   Specialty Services Required    Number of Visits Requested:   1   (Contact our referral department at [phone] if you have not spoken with someone about your referral appointment within the next 5 days)    Follow-up Plan . Follow-up with Sunnie Nielsen, DO as planned . Schedule your nurse visit for your pneumonia vacccine.  . Schedule your appointment at the pharmacy for your tetanus shot.  . Referral for your colonoscopy, bone density and mammogram has been sent.  . Medicare wellness visit in one year.

## 2020-11-11 NOTE — Telephone Encounter (Signed)
Left voicemail for patient to call us back to make this appointment.  

## 2020-11-19 ENCOUNTER — Other Ambulatory Visit: Payer: Self-pay

## 2020-11-20 ENCOUNTER — Ambulatory Visit (INDEPENDENT_AMBULATORY_CARE_PROVIDER_SITE_OTHER): Payer: Medicare Other | Admitting: Osteopathic Medicine

## 2020-11-20 VITALS — BP 140/69 | HR 70 | Temp 97.8°F | Wt 213.0 lb

## 2020-11-20 DIAGNOSIS — L723 Sebaceous cyst: Secondary | ICD-10-CM | POA: Diagnosis not present

## 2020-11-20 DIAGNOSIS — L089 Local infection of the skin and subcutaneous tissue, unspecified: Secondary | ICD-10-CM | POA: Diagnosis not present

## 2020-11-20 MED ORDER — CEPHALEXIN 500 MG PO CAPS: 500.0000 mg | ORAL_CAPSULE | Freq: Three times a day (TID) | ORAL | 0 refills | Status: AC

## 2020-11-20 NOTE — Patient Instructions (Signed)
May drain slightly more Warm compresses few times per day might help Antibiotics sent  Clean w/ mild soap and warm water If worse, let me know!

## 2020-11-20 NOTE — Progress Notes (Signed)
Cynthia Dalton is a 65 y.o. female who presents to  [facility] at [facility]  today, 11/20/20, seeking care for the following:  Cyst on L chest, we did I&D and attempted cyst capsule removal 09/2019, area was quite scarred. She states it's been okay since then but over past few days it became red and inflamed, drained somewhat, she's tried not to mess with it. On exam, erythematous and tender mass on L sternum not quite into breast tissue. Pt verbally consented to application of manual presure to drain the lesion since there was already an opening in the skin, pea-sized amount of sebaceous material expressed then just scant blood.      ASSESSMENT & PLAN with other pertinent findings:  The encounter diagnosis was Infected sebaceous cyst of L chest. W/ localized cellulitis     Patient Instructions  May drain slightly more Warm compresses few times per day might help Antibiotics sent  Clean w/ mild soap and warm water If worse, let me know!     No orders of the defined types were placed in this encounter.   Meds ordered this encounter  Medications   cephALEXin (KEFLEX) 500 MG capsule    Sig: Take 1 capsule (500 mg total) by mouth 3 (three) times daily for 5 days.    Dispense:  15 capsule    Refill:  0     See below for relevant physical exam findings  See below for recent lab and imaging results reviewed  Medications, allergies, PMH, PSH, SocH, FamH reviewed below    Follow-up instructions: Return if symptoms worsen or fail to improve.                                        Exam:  BP 140/69 (BP Location: Left Arm, Patient Position: Sitting, Cuff Size: Large)   Pulse 70   Temp 97.8 F (36.6 C) (Oral)   Wt 213 lb 0.6 oz (96.6 kg)   BMI 32.39 kg/m  Constitutional: VS see above. General Appearance: alert, well-developed, well-nourished, NAD Neck: No masses, trachea midline.   Respiratory: Normal respiratory effort.  Musculoskeletal: Gait normal. Symmetric and independent movement of all extremities Neurological: Normal balance/coordination. No tremor. Skin: see above Psychiatric: Normal judgment/insight. Normal mood and affect. Oriented x3.   Current Meds  Medication Sig   cephALEXin (KEFLEX) 500 MG capsule Take 1 capsule (500 mg total) by mouth 3 (three) times daily for 5 days.   clobetasol ointment (TEMOVATE) 0.05 % Apply to affected area 2 x weekly.   ibuprofen (ADVIL) 200 MG tablet Take 200 mg by mouth every 6 (six) hours as needed.    No Known Allergies  There are no problems to display for this patient.   Family History  Problem Relation Age of Onset   High blood pressure Mother    High Cholesterol Mother    Dementia Mother    Alzheimer's disease Father     Social History   Tobacco Use  Smoking Status Never  Smokeless Tobacco Never    Past Surgical History:  Procedure Laterality Date   APPENDECTOMY  2000   TENNIS ELBOW RELEASE/NIRSCHEL PROCEDURE  2005   TONSILECTOMY/ADENOIDECTOMY WITH MYRINGOTOMY      Immunization History  Administered Date(s) Administered   Influenza,inj,Quad PF,6+ Mos 04/05/2018, 04/02/2019   Influenza-Unspecified 03/24/2020   PFIZER(Purple Top)SARS-COV-2 Vaccination 07/15/2019, 08/11/2019, 03/13/2020, 11/02/2020  Zoster Recombinat (Shingrix) 10/29/2018, 01/03/2019    Recent Results (from the past 2160 hour(s))  Lipid panel     Status: Abnormal   Collection Time: 08/31/20 12:00 AM  Result Value Ref Range   Triglycerides 96 40 - 160   Cholesterol 212 (A) 0 - 200   HDL 58 35 - 70   LDL Cholesterol 137   Hemoglobin A1c     Status: None   Collection Time: 08/31/20 12:00 AM  Result Value Ref Range   Hemoglobin A1C 5.2     No results found.     All questions at time of visit were answered - patient instructed to contact office with any additional concerns or updates. ER/RTC precautions were reviewed  with the patient as applicable.   Please note: manual typing as well as voice recognition software may have been used to produce this document - typos may escape review. Please contact Dr. Lyn Hollingshead for any needed clarifications.

## 2020-12-09 ENCOUNTER — Telehealth: Payer: Self-pay | Admitting: *Deleted

## 2020-12-09 NOTE — Telephone Encounter (Signed)
Left patient a message to call and schedule 6 month follow up appointment with a MD, Dr. Earlene Plater will not be in [place] in August.

## 2020-12-16 ENCOUNTER — Other Ambulatory Visit: Payer: Self-pay

## 2020-12-16 ENCOUNTER — Ambulatory Visit (INDEPENDENT_AMBULATORY_CARE_PROVIDER_SITE_OTHER): Payer: Medicare Other

## 2020-12-16 ENCOUNTER — Ambulatory Visit (INDEPENDENT_AMBULATORY_CARE_PROVIDER_SITE_OTHER): Payer: Medicare HMO

## 2020-12-16 DIAGNOSIS — Z1231 Encounter for screening mammogram for malignant neoplasm of breast: Secondary | ICD-10-CM | POA: Diagnosis not present

## 2020-12-16 DIAGNOSIS — Z0000 Encounter for general adult medical examination without abnormal findings: Secondary | ICD-10-CM

## 2020-12-16 DIAGNOSIS — Z780 Asymptomatic menopausal state: Secondary | ICD-10-CM

## 2021-01-18 ENCOUNTER — Ambulatory Visit: Payer: Medicare Other | Admitting: Obstetrics & Gynecology

## 2021-02-25 LAB — HM COLONOSCOPY

## 2021-03-01 ENCOUNTER — Encounter: Payer: Self-pay | Admitting: Family Medicine

## 2021-03-01 ENCOUNTER — Ambulatory Visit (INDEPENDENT_AMBULATORY_CARE_PROVIDER_SITE_OTHER): Payer: Medicare Other | Admitting: Family Medicine

## 2021-03-01 ENCOUNTER — Other Ambulatory Visit: Payer: Self-pay

## 2021-03-01 DIAGNOSIS — N904 Leukoplakia of vulva: Secondary | ICD-10-CM | POA: Diagnosis not present

## 2021-03-01 MED ORDER — CLOBETASOL PROPIONATE 0.05 % EX OINT: TOPICAL_OINTMENT | CUTANEOUS | 5 refills | Status: DC

## 2021-03-01 NOTE — Progress Notes (Signed)
   Subjective:    Patient ID: Cynthia Dalton is a 65 y.o. female presenting with Follow-up (Lichen Sclerosus/)  on 03/01/2021  HPI: Here to f/u presumed LS. On clobetasol weekly. If she forgets, she will get burning in the area.  Review of Systems  Constitutional:  Negative for chills and fever.  Respiratory:  Negative for shortness of breath.   Cardiovascular:  Negative for chest pain.  Gastrointestinal:  Negative for abdominal pain, nausea and vomiting.  Genitourinary:  Negative for dysuria.  Skin:  Negative for rash.     Objective:    BP 131/72   Pulse 77   Resp 16   Ht 5\' 8"  (1.727 m)   Wt 213 lb (96.6 kg)   BMI 32.39 kg/m  Physical Exam Constitutional:      General: She is not in acute distress.    Appearance: She is well-developed.  HENT:     Head: Normocephalic and atraumatic.  Eyes:     General: No scleral icterus. Cardiovascular:     Rate and Rhythm: Normal rate.  Pulmonary:     Effort: Pulmonary effort is normal.  Abdominal:     Palpations: Abdomen is soft.  Genitourinary:    Comments: Mild hypopigmentation noted on right labia majora and on the mons above the clitoral hood bilaterally Musculoskeletal:     Cervical back: Neck supple.  Skin:    General: Skin is warm and dry.  Neurological:     Mental Status: She is alert and oriented to person, place, and time.        Assessment & Plan:   Problem List Items Addressed This Visit       Unprioritized   Lichen sclerosus et atrophicus of the vulva    Doing well, continue Clobetasol.      Relevant Medications   clobetasol ointment (TEMOVATE) 0.05 %    Return in about 6 months (around 08/29/2021) for a follow-up.  08/31/2021 03/01/2021 9:18 AM

## 2021-03-01 NOTE — Assessment & Plan Note (Signed)
Doing well, continue Clobetasol.

## 2021-03-23 ENCOUNTER — Ambulatory Visit (INDEPENDENT_AMBULATORY_CARE_PROVIDER_SITE_OTHER): Payer: Medicare Other | Admitting: Family Medicine

## 2021-03-23 DIAGNOSIS — Z23 Encounter for immunization: Secondary | ICD-10-CM

## 2021-03-31 ENCOUNTER — Telehealth: Payer: Self-pay

## 2021-03-31 NOTE — Telephone Encounter (Signed)
Pt called stating that she was diagnosed with COVID on 02/01/2021, but did not required treatment with paxlovid or monoclonal antibody infusion, stating that it was more like a cold.  She would like to know when she can receive a COVID booster shot as [place].  Advised pt that since it has been at least 30 days since diagnosis, she may receive her booster at her convenience.  Pt expressed understanding.  Tiajuana Amass, CMA

## 2021-08-18 ENCOUNTER — Ambulatory Visit (INDEPENDENT_AMBULATORY_CARE_PROVIDER_SITE_OTHER): Payer: Medicare Other | Admitting: Medical-Surgical

## 2021-08-18 ENCOUNTER — Encounter: Payer: Self-pay | Admitting: Medical-Surgical

## 2021-08-18 ENCOUNTER — Other Ambulatory Visit: Payer: Self-pay

## 2021-08-18 VITALS — BP 129/84 | HR 68 | Resp 20 | Ht 68.0 in | Wt 212.5 lb

## 2021-08-18 DIAGNOSIS — Z7689 Persons encountering health services in other specified circumstances: Secondary | ICD-10-CM

## 2021-08-18 DIAGNOSIS — Z131 Encounter for screening for diabetes mellitus: Secondary | ICD-10-CM

## 2021-08-18 DIAGNOSIS — Z23 Encounter for immunization: Secondary | ICD-10-CM | POA: Diagnosis not present

## 2021-08-18 DIAGNOSIS — Z6832 Body mass index (BMI) 32.0-32.9, adult: Secondary | ICD-10-CM

## 2021-08-18 DIAGNOSIS — Z0000 Encounter for general adult medical examination without abnormal findings: Secondary | ICD-10-CM

## 2021-08-18 DIAGNOSIS — Z1329 Encounter for screening for other suspected endocrine disorder: Secondary | ICD-10-CM | POA: Diagnosis not present

## 2021-08-18 NOTE — Progress Notes (Signed)
HPI: Cynthia Dalton is a 66 y.o. female who  has no past medical history on file.  she presents to [facility] today, 08/18/21,  for chief complaint of: Annual physical exam Transfer of care  Dentist: UTD, every 6 months Eye exam: UTD, [facility] 25 years ago Exercise: 2 times weekly with weights and regular cardio exercise, works with a trainer Diet: Following weight watchers, making healthy choices Mammogram: Due in July Colon cancer screening: UTD COVID vaccine: UTD  Concerns: None  Past medical, surgical, social and family history reviewed:  Patient Active Problem List   Diagnosis Date Noted   Lichen sclerosus et atrophicus of the vulva 03/01/2021   Posterior vitreous detachment, left eye 12/20/2017    Past Surgical History:  Procedure Laterality Date   APPENDECTOMY  2000   TENNIS ELBOW RELEASE/NIRSCHEL PROCEDURE  2005   TONSILECTOMY/ADENOIDECTOMY WITH MYRINGOTOMY      Social History   Tobacco Use   Smoking status: Never   Smokeless tobacco: Never  Substance Use Topics   Alcohol use: Yes    Alcohol/week: 8.0 standard drinks    Types: 8 Standard drinks or equivalent per week    Family History  Problem Relation Age of Onset   High blood pressure Mother    High Cholesterol Mother    Dementia Mother    Alzheimer's disease Father      Current medication list and allergy/intolerance information reviewed:    Current Outpatient Medications  Medication Sig Dispense Refill   clobetasol ointment (TEMOVATE) 0.05 % Apply to affected area 2 x weekly. 30 g 5   ibuprofen (ADVIL) 200 MG tablet Take 200 mg by mouth every 6 (six) hours as needed.     No current facility-administered medications for this visit.    No Known Allergies    Review of Systems: Constitutional:  No  fever, no chills, No recent illness, No unintentional weight changes. No significant fatigue.  HEENT: No  headache, no vision change, no hearing change, No  sore throat, No  sinus pressure Cardiac: No  chest pain, No  pressure, No palpitations, No  Orthopnea Respiratory:  No  shortness of breath. No  Cough Gastrointestinal: No  abdominal pain, No  nausea, No  vomiting,  No  blood in stool, No  diarrhea, No  constipation  Musculoskeletal: No new myalgia/arthralgia Skin: No  Rash, No other wounds/concerning lesions Genitourinary: No  incontinence, No  abnormal genital bleeding, No abnormal genital discharge Hem/Onc: No  easy bruising/bleeding, No  abnormal lymph node Endocrine: No cold intolerance,  No heat intolerance. No polyuria/polydipsia/polyphagia  Neurologic: No  weakness, No  dizziness, No  slurred speech/focal weakness/facial droop Psychiatric: No  concerns with depression, No  concerns with anxiety, No sleep problems, No mood problems  Exam:  BP 129/84 (BP Location: Right Arm, Patient Position: Sitting, Cuff Size: Large)    Pulse 68    Resp 20    Ht _0  (1.727 m)    Wt 212 lb 8 oz (96.4 kg)    SpO2 100%    BMI 32.31 kg/m  Constitutional: VS see above. General Appearance: alert, well-developed, well-nourished, NAD Eyes: Normal lids and conjunctive, non-icteric sclera Ears, Nose, Mouth, Throat: MMM, Normal external inspection ears/nares/mouth/lips/gums. TM normal bilaterally.  Neck: No masses, trachea midline. No thyroid enlargement. No tenderness/mass appreciated. No lymphadenopathy Respiratory: Normal respiratory effort. no wheeze, no rhonchi, no rales Cardiovascular: S1/S2 normal, no murmur, no rub/gallop auscultated. RRR. No lower extremity edema. Pedal pulse II/IV bilaterally  DP and PT. No carotid bruit or JVD. No abdominal aortic bruit. Gastrointestinal: Nontender, no masses. No hepatomegaly, no splenomegaly. No hernia appreciated. Bowel sounds normal. Rectal exam deferred.  Musculoskeletal: Gait normal. No clubbing/cyanosis of digits.  Neurological: Normal balance/coordination. No tremor. No cranial nerve deficit on limited exam.  Motor and sensation intact and symmetric. Cerebellar reflexes intact.  Skin: warm, dry, intact. No rash/ulcer. No concerning nevi or subq nodules on limited exam.   Psychiatric: Normal judgment/insight. Normal mood and affect. Oriented x3.    ASSESSMENT/PLAN:   1. Encounter to establish care Reviewed available information and discussed care concerns with patient.   2. Annual physical exam Checking labs as below.  Up-to-date on preventative care.  Wellness information provided with AVS. - Lipid panel - COMPLETE METABOLIC PANEL WITH GFR - CBC with Differential/Platelet  3. Diabetes mellitus screening Unfortunately her insurance will not cover an A1c so we will check her CMP for elevated glucose.  4. Thyroid disorder screen Checking TSH. - TSH  5. Body mass index (BMI) 32.0-32.9, adult Checking CBC with differential. - CBC with Differential/Platelet   Orders Placed This Encounter  Procedures   Pneumococcal conjugate vaccine 13-valent   TSH   Lipid panel   COMPLETE METABOLIC PANEL WITH GFR   CBC with Differential/Platelet    No orders of the defined types were placed in this encounter.   Patient Instructions  Preventive Care 59 Years and Older, Female Preventive care refers to lifestyle choices and visits with your health care provider that can promote health and wellness. Preventive care visits are also called wellness exams. What can I expect for my preventive care visit? Counseling Your health care provider may ask you questions about your: Medical history, including: Past medical problems. Family medical history. Pregnancy and menstrual history. History of falls. Current health, including: Memory and ability to understand (cognition). Emotional well-being. Home life and relationship well-being. Sexual activity and sexual health. Lifestyle, including: Alcohol, nicotine or tobacco, and drug use. Access to firearms. Diet, exercise, and sleep habits. Work and  work Statistician. Sunscreen use. Safety issues such as seatbelt and bike helmet use. Physical exam Your health care provider will check your: Height and weight. These may be used to calculate your BMI (body mass index). BMI is a measurement that tells if you are at a healthy weight. Waist circumference. This measures the distance around your waistline. This measurement also tells if you are at a healthy weight and may help predict your risk of certain diseases, such as type 2 diabetes and high blood pressure. Heart rate and blood pressure. Body temperature. Skin for abnormal spots. What immunizations do I need? Vaccines are usually given at various ages, according to a schedule. Your health care provider will recommend vaccines for you based on your age, medical history, and lifestyle or other factors, such as travel or where you work. What tests do I need? Screening Your health care provider may recommend screening tests for certain conditions. This may include: Lipid and cholesterol levels. Hepatitis C test. Hepatitis B test. HIV (human immunodeficiency virus) test. STI (sexually transmitted infection) testing, if you are at risk. Lung cancer screening. Colorectal cancer screening. Diabetes screening. This is done by checking your blood sugar (glucose) after you have not eaten for a while (fasting). Mammogram. Talk with your health care provider about how often you should have regular mammograms. BRCA-related cancer screening. This may be done if you have a family history of breast, ovarian, tubal, or peritoneal cancers. Bone density  scan. This is done to screen for osteoporosis. Talk with your health care provider about your test results, treatment options, and if necessary, the need for more tests. Follow these instructions at home: Eating and drinking  Eat a diet that includes fresh fruits and vegetables, whole grains, lean protein, and low-fat dairy products. Limit your intake of  foods with high amounts of sugar, saturated fats, and salt. Take vitamin and mineral supplements as recommended by your health care provider. Do not drink alcohol if your health care provider tells you not to drink. If you drink alcohol: Limit how much you have to 0-1 drink a day. Know how much alcohol is in your drink. In the [place] one drink equals one 12 oz bottle of beer (355 mL), one 5 oz glass of wine (148 mL), or one 1 oz glass of hard liquor (44 mL). Lifestyle Brush your teeth every morning and night with fluoride toothpaste. Floss one time each day. Exercise for at least 30 minutes 5 or more days each week. Do not use any products that contain nicotine or tobacco. These products include cigarettes, chewing tobacco, and vaping devices, such as e-cigarettes. If you need help quitting, ask your health care provider. Do not use drugs. If you are sexually active, practice safe sex. Use a condom or other form of protection in order to prevent STIs. Take aspirin only as told by your health care provider. Make sure that you understand how much to take and what form to take. Work with your health care provider to find out whether it is safe and beneficial for you to take aspirin daily. Ask your health care provider if you need to take a cholesterol-lowering medicine (statin). Find healthy ways to manage stress, such as: Meditation, yoga, or listening to music. Journaling. Talking to a trusted person. Spending time with friends and family. Minimize exposure to UV radiation to reduce your risk of skin cancer. Safety Always wear your seat belt while driving or riding in a vehicle. Do not drive: If you have been drinking alcohol. Do not ride with someone who has been drinking. When you are tired or distracted. While texting. If you have been using any mind-altering substances or drugs. Wear a helmet and other protective equipment during sports activities. If you have firearms in your house,  make sure you follow all gun safety procedures. What's next? Visit your health care provider once a year for an annual wellness visit. Ask your health care provider how often you should have your eyes and teeth checked. Stay up to date on all vaccines. This information is not intended to replace advice given to you by your health care provider. Make sure you discuss any questions you have with your health care provider. Document Revised: 11/25/2020 Document Reviewed: 11/25/2020 Elsevier Patient Education  [place].    Follow-up plan: Return in about 1 year (around 08/19/2022) for annual physical exam.  Clearnce Sorrel, DNP, APRN, FNP-BC [facility]

## 2021-08-18 NOTE — Patient Instructions (Addendum)
Preventive Care 34 Years and Older, Female ?Preventive care refers to lifestyle choices and visits with your health care provider that can promote health and wellness. Preventive care visits are also called wellness exams. ?What can I expect for my preventive care visit? ?Counseling ?Your health care provider may ask you questions about your: ?Medical history, including: ?Past medical problems. ?Family medical history. ?Pregnancy and menstrual history. ?History of falls. ?Current health, including: ?Memory and ability to understand (cognition). ?Emotional well-being. ?Home life and relationship well-being. ?Sexual activity and sexual health. ?Lifestyle, including: ?Alcohol, nicotine or tobacco, and drug use. ?Access to firearms. ?Diet, exercise, and sleep habits. ?Work and work Statistician. ?Sunscreen use. ?Safety issues such as seatbelt and bike helmet use. ?Physical exam ?Your health care provider will check your: ?Height and weight. These may be used to calculate your BMI (body mass index). BMI is a measurement that tells if you are at a healthy weight. ?Waist circumference. This measures the distance around your waistline. This measurement also tells if you are at a healthy weight and may help predict your risk of certain diseases, such as type 2 diabetes and high blood pressure. ?Heart rate and blood pressure. ?Body temperature. ?Skin for abnormal spots. ?What immunizations do I need? ?Vaccines are usually given at various ages, according to a schedule. Your health care provider will recommend vaccines for you based on your age, medical history, and lifestyle or other factors, such as travel or where you work. ?What tests do I need? ?Screening ?Your health care provider may recommend screening tests for certain conditions. This may include: ?Lipid and cholesterol levels. ?Hepatitis C test. ?Hepatitis B test. ?HIV (human immunodeficiency virus) test. ?STI (sexually transmitted infection) testing, if you are at  risk. ?Lung cancer screening. ?Colorectal cancer screening. ?Diabetes screening. This is done by checking your blood sugar (glucose) after you have not eaten for a while (fasting). ?Mammogram. Talk with your health care provider about how often you should have regular mammograms. ?BRCA-related cancer screening. This may be done if you have a family history of breast, ovarian, tubal, or peritoneal cancers. ?Bone density scan. This is done to screen for osteoporosis. ?Talk with your health care provider about your test results, treatment options, and if necessary, the need for more tests. ?Follow these instructions at home: ?Eating and drinking ? ?Eat a diet that includes fresh fruits and vegetables, whole grains, lean protein, and low-fat dairy products. Limit your intake of foods with high amounts of sugar, saturated fats, and salt. ?Take vitamin and mineral supplements as recommended by your health care provider. ?Do not drink alcohol if your health care provider tells you not to drink. ?If you drink alcohol: ?Limit how much you have to 0-1 drink a day. ?Know how much alcohol is in your drink. In the [place] one drink equals one 12 oz bottle of beer (355 mL), one 5 oz glass of wine (148 mL), or one 1? oz glass of hard liquor (44 mL). ?Lifestyle ?Brush your teeth every morning and night with fluoride toothpaste. Floss one time each day. ?Exercise for at least 30 minutes 5 or more days each week. ?Do not use any products that contain nicotine or tobacco. These products include cigarettes, chewing tobacco, and vaping devices, such as e-cigarettes. If you need help quitting, ask your health care provider. ?Do not use drugs. ?If you are sexually active, practice safe sex. Use a condom or other form of protection in order to prevent STIs. ?Take aspirin only as told by your  health care provider. Make sure that you understand how much to take and what form to take. Work with your health care provider to find out whether it  is safe and beneficial for you to take aspirin daily. °Ask your health care provider if you need to take a cholesterol-lowering medicine (statin). °Find healthy ways to manage stress, such as: °Meditation, yoga, or listening to music. °Journaling. °Talking to a trusted person. °Spending time with friends and family. °Minimize exposure to UV radiation to reduce your risk of skin cancer. °Safety °Always wear your seat belt while driving or riding in a vehicle. °Do not drive: °If you have been drinking alcohol. Do not ride with someone who has been drinking. °When you are tired or distracted. °While texting. °If you have been using any mind-altering substances or drugs. °Wear a helmet and other protective equipment during sports activities. °If you have firearms in your house, make sure you follow all gun safety procedures. °What's next? °Visit your health care provider once a year for an annual wellness visit. °Ask your health care provider how often you should have your eyes and teeth checked. °Stay up to date on all vaccines. °This information is not intended to replace advice given to you by your health care provider. Make sure you discuss any questions you have with your health care provider. °Document Revised: 11/25/2020 Document Reviewed: 11/25/2020 °Elsevier Patient Education © 2022 Elsevier Inc. ° °

## 2021-08-19 LAB — CBC WITH DIFFERENTIAL/PLATELET
Absolute Monocytes: 431 cells/uL (ref 200–950)
Basophils Absolute: 50 cells/uL (ref 0–200)
Basophils Relative: 0.9 %
Eosinophils Absolute: 129 cells/uL (ref 15–500)
Eosinophils Relative: 2.3 %
HCT: 46.6 % — ABNORMAL HIGH (ref 35.0–45.0)
Hemoglobin: 15.7 g/dL — ABNORMAL HIGH (ref 11.7–15.5)
Lymphs Abs: 2156 cells/uL (ref 850–3900)
MCH: 32.1 pg (ref 27.0–33.0)
MCHC: 33.7 g/dL (ref 32.0–36.0)
MCV: 95.3 fL (ref 80.0–100.0)
MPV: 10.4 fL (ref 7.5–12.5)
Monocytes Relative: 7.7 %
Neutro Abs: 2834 cells/uL (ref 1500–7800)
Neutrophils Relative %: 50.6 %
Platelets: 224 10*3/uL (ref 140–400)
RBC: 4.89 10*6/uL (ref 3.80–5.10)
RDW: 11 % (ref 11.0–15.0)
Total Lymphocyte: 38.5 %
WBC: 5.6 10*3/uL (ref 3.8–10.8)

## 2021-08-19 LAB — COMPLETE METABOLIC PANEL WITH GFR
AG Ratio: 1.7 (calc) (ref 1.0–2.5)
ALT: 21 U/L (ref 6–29)
AST: 14 U/L (ref 10–35)
Albumin: 4.4 g/dL (ref 3.6–5.1)
Alkaline phosphatase (APISO): 87 U/L (ref 37–153)
BUN: 17 mg/dL (ref 7–25)
CO2: 28 mmol/L (ref 20–32)
Calcium: 9.7 mg/dL (ref 8.6–10.4)
Chloride: 103 mmol/L (ref 98–110)
Creat: 0.78 mg/dL (ref 0.50–1.05)
Globulin: 2.6 g/dL (calc) (ref 1.9–3.7)
Glucose, Bld: 89 mg/dL (ref 65–99)
Potassium: 5.4 mmol/L — ABNORMAL HIGH (ref 3.5–5.3)
Sodium: 140 mmol/L (ref 135–146)
Total Bilirubin: 0.7 mg/dL (ref 0.2–1.2)
Total Protein: 7 g/dL (ref 6.1–8.1)
eGFR: 83 mL/min/{1.73_m2} (ref >=60)

## 2021-08-19 LAB — LIPID PANEL
Cholesterol: 208 mg/dL — ABNORMAL HIGH (ref <200)
HDL: 68 mg/dL (ref >=50)
LDL Cholesterol (Calc): 123 mg/dL (calc) — ABNORMAL HIGH
Non-HDL Cholesterol (Calc): 140 mg/dL (calc) — ABNORMAL HIGH (ref <130)
Total CHOL/HDL Ratio: 3.1 (calc) (ref <5.0)
Triglycerides: 73 mg/dL (ref <150)

## 2021-08-19 LAB — TSH: TSH: 0.79 mIU/L (ref 0.40–4.50)

## 2021-09-13 ENCOUNTER — Encounter: Payer: Self-pay | Admitting: Medical-Surgical

## 2021-09-13 ENCOUNTER — Ambulatory Visit (INDEPENDENT_AMBULATORY_CARE_PROVIDER_SITE_OTHER): Payer: Medicare Other | Admitting: Medical-Surgical

## 2021-09-13 VITALS — BP 157/84 | HR 72 | Resp 20 | Ht 68.0 in | Wt 215.0 lb

## 2021-09-13 DIAGNOSIS — M25561 Pain in right knee: Secondary | ICD-10-CM | POA: Diagnosis not present

## 2021-09-13 MED ORDER — MELOXICAM 15 MG PO TABS: 15.0000 mg | ORAL_TABLET | Freq: Every day | ORAL | 0 refills | Status: DC

## 2021-09-13 NOTE — Progress Notes (Signed)
?  HPI with pertinent ROS:  ? ?CC: right knee injury ? ?HPI: ?Pleasant 66 year old female presenting today with reports of a right knee injury. About a week ago, she was at the gym with her trainer. She was getting in position to use the resistance bands and felt a pop in her right knee. Since then she has had pain along the lower, medial aspect of the knee that is accompanied by a catching and locking sensation. Has also noted popping. Has been treating at home with rest, ice, and elevation. Taking Ibuprofen 600mg  TID and feels that it is helping. Thinks her discomfort has gotten a bit better. No weakness, numbness, and tingling in the right leg. No recent falls.  ? ?I reviewed the past medical history, family history, social history, surgical history, and allergies today and no changes were needed.  Please see the problem list section below in epic for further details. ? ? ?Physical exam:  ? ?General: Well Developed, well nourished, and in no acute distress.  ?Neuro: Alert and oriented x3.  ?HEENT: Normocephalic, atraumatic.  ?Skin: Warm and dry. ?Cardiac: Regular rate and rhythm, no murmurs rubs or gallops, no lower extremity edema.  ?Respiratory: Clear to auscultation bilaterally. Not using accessory muscles, speaking in full sentences. ?Right knee: full ROM to the right knee with pain at full flexion. Clicking noted with passive extension. + [place], - pain with valgus/varus stress. Tender to palpation along the medial joint line. - anterior and posterior drawer. Strength 5/5.  ? ?Impression and Recommendations:   ? ?1. Acute pain of right knee ?Continue Ibuprofen 600-800mg  every 8 hours as needed. Continue rest, ice, and elevation. Adding physical therapy. With mechanical symptoms, concerned for meniscal injury. Adding MRI of knee for further evaluation. ?- DG Knee Complete 4 Views Right; Future ?- Ambulatory referral to Physical Therapy ? ?Return in about 6 weeks (around 10/25/2021) for right knee pain if no  improvement in symptoms. ?___________________________________________ ?10/27/2021, DNP, APRN, FNP-BC ?[facility] ?[facility] ?

## 2021-09-21 ENCOUNTER — Ambulatory Visit (INDEPENDENT_AMBULATORY_CARE_PROVIDER_SITE_OTHER): Payer: Medicare Other

## 2021-09-21 DIAGNOSIS — M25561 Pain in right knee: Secondary | ICD-10-CM | POA: Diagnosis not present

## 2021-09-23 ENCOUNTER — Encounter: Payer: Self-pay | Admitting: Obstetrics and Gynecology

## 2021-09-23 ENCOUNTER — Ambulatory Visit (INDEPENDENT_AMBULATORY_CARE_PROVIDER_SITE_OTHER): Payer: Medicare Other | Admitting: Obstetrics and Gynecology

## 2021-09-23 DIAGNOSIS — N904 Leukoplakia of vulva: Secondary | ICD-10-CM

## 2021-09-23 MED ORDER — CLOBETASOL PROPIONATE 0.05 % EX OINT: TOPICAL_OINTMENT | CUTANEOUS | 5 refills | Status: DC

## 2021-09-23 NOTE — Progress Notes (Signed)
? ?  GYNECOLOGY OFFICE FOLLOW UP NOTE ? ?History:  ?66 y.o. [id number] here today for follow up for lichen sclerosis of vulva. She is doing well, continues to use clobetasol ointment. Feels well and will forget to use ointment, then will have flare, use Rx for 3-4 days and it will improve again. She is currently having a small amount of irritation. Denies bleeding or other complaints.   ? ?History reviewed. No pertinent past medical history. ? ?Past Surgical History:  ?Procedure Laterality Date  ? APPENDECTOMY  2000  ? TENNIS ELBOW RELEASE/NIRSCHEL PROCEDURE  2005  ? TONSILECTOMY/ADENOIDECTOMY WITH MYRINGOTOMY    ? ? ? ?Current Outpatient Medications:  ?  ibuprofen (ADVIL) 200 MG tablet, Take 200 mg by mouth every 6 (six) hours as needed., Disp: , Rfl:  ?  clobetasol ointment (TEMOVATE) 0.05 %, Apply to affected area 2 x weekly., Disp: 30 g, Rfl: 5 ? ?The following portions of the patient's history were reviewed and updated as appropriate: allergies, current medications, past family history, past medical history, past social history, past surgical history and problem list.  ? ?Review of Systems:  ?Pertinent items noted in HPI and remainder of comprehensive ROS otherwise negative. ?  ?Objective:  ?Physical Exam ?BP 134/83   Pulse 66   Ht 5\' 8"  (1.727 m)   Wt 209 lb (94.8 kg)   BMI 31.78 kg/m?  ?CONSTITUTIONAL: Well-developed, well-nourished female in no acute distress.  ?HENT:  Normocephalic, atraumatic. External right and left ear normal. Oropharynx is clear and moist ?EYES: Conjunctivae and EOM are normal. Pupils are equal, round, and reactive to light. No scleral icterus.  ?NECK: Normal range of motion, supple, no masses ?SKIN: Skin is warm and dry. No rash noted. Not diaphoretic. No erythema. No pallor. ?NEUROLOGIC: Alert and oriented to person, place, and time. Normal reflexes, muscle tone coordination. No cranial nerve deficit noted. ?PSYCHIATRIC: Normal mood and affect. Normal behavior. Normal judgment and  thought content. ?CARDIOVASCULAR: Normal heart rate noted ?RESPIRATORY: Effort normal, no problems with respiration noted ?ABDOMEN: Soft, no distention noted.   ?PELVIC: Normal appearing external genitalia; mildly erythematous and irritated appearing 1 x 1 cm area at right and left perineum corresponding to area of irritation indicated by patient, no ulceration ?MUSCULOSKELETAL: Normal range of motion. No edema noted. ? ?Exam done with chaperone present. ? ?Labs and Imaging ? ?Assessment & Plan:  ?1. Lichen sclerosus et atrophicus of the vulva ?Stable, cont Rx 2x weekly as needed ?- clobetasol ointment (TEMOVATE) 0.05 %; Apply to affected area 2 x weekly.  Dispense: 30 g; Refill: 5 ?- return if symptoms not controlled by ointment ? ?Routine preventative health maintenance measures emphasized. ?Please refer to After Visit Summary for other counseling recommendations.  ? ?Return in about 6 months (around 03/25/2022). ? ?K. 03/27/2022, MD, FACOG ?Attending ?[facility] [employer] [facility]) ? ? ? ?

## 2021-09-24 ENCOUNTER — Ambulatory Visit: Payer: Medicare Other | Attending: Medical-Surgical | Admitting: Physical Therapy

## 2021-09-24 ENCOUNTER — Encounter: Payer: Self-pay | Admitting: Physical Therapy

## 2021-09-24 DIAGNOSIS — M25561 Pain in right knee: Secondary | ICD-10-CM | POA: Diagnosis present

## 2021-09-24 NOTE — Therapy (Signed)
[place] [facility] ?[address] [place] [address] ?[place], [place], [address] ?Phone: [phone]   Fax:  [phone] ? ?Physical Therapy Evaluation ? ?Patient Details  ?Name: Cynthia Dalton ?MRN: [id number] ?Date of Birth: [birth year] ?Referring Provider (PT): Christen Butter ? ? ?Encounter Date: 09/24/2021 ? ? PT End of Session - 09/24/21 0829   ? ? Visit Number 1   ? Number of Visits 1   ? Authorization Type medicare   ? PT Start Time 0800   ? PT Stop Time 0830   ? PT Time Calculation (min) 30 min   ? Activity Tolerance Patient tolerated treatment well   ? Behavior During Therapy [facility] for tasks assessed/performed   ? ?  ?  ? ?  ? ? ?History reviewed. No pertinent past medical history. ? ?Past Surgical History:  ?Procedure Laterality Date  ? APPENDECTOMY  2000  ? TENNIS ELBOW RELEASE/NIRSCHEL PROCEDURE  2005  ? TONSILECTOMY/ADENOIDECTOMY WITH MYRINGOTOMY    ? ? ?There were no vitals filed for this visit. ? ? ? Subjective Assessment - 09/24/21 0801   ? ? Subjective About 2 weeks ago pt was at the gym getting ready to exercise and she felt a "pop" in her Rt knee. She states her knee was initially swollen and she went to MD. MRI shows no tear, positive for arthritis. Pt states her knee hurts with twisting, eases with ice and out of position   ? Pertinent History tennis elbow 2005   ? Patient Stated Goals be able to return to gym safely   ? Currently in Pain? No/denies   ? ?  ?  ? ?  ? ? ? ? ? [facility] PT Assessment - 09/24/21 0001   ? ?  ? Assessment  ? Medical Diagnosis Rt knee pain   ? Referring Provider (PT) Christen Butter   ? Onset Date/Surgical Date 09/14/21   ? Hand Dominance Right   ?  ? Precautions  ? Precautions None   ?  ? Restrictions  ? Weight Bearing Restrictions No   ?  ? Balance Screen  ? Has the patient fallen in the past 6 months No   ?  ? Home Environment  ? Additional Comments lives on 1st floor   ?  ? Prior Function  ? Level of Independence Independent   ? Vocation Requirements  retired   ?  ? Observation/Other Assessments  ? Focus on Therapeutic Outcomes (FOTO)  not appropriate   ?  ? Functional Tests  ? Functional tests Single leg stance   ?  ? Single Leg Stance  ? Comments Rt 15 seconds, Lt 21 seconds   ?  ? ROM / Strength  ? AROM / PROM / Strength AROM;Strength   ?  ? AROM  ? Overall AROM Comments bilat knee ROM WFL   ?  ? Strength  ? Overall Strength Comments bilat hip flex 4/5, bilat hip abd 4+/5   ? Strength Assessment Site Knee   ? Right/Left Knee Right;Left   ? Right Knee Flexion 4+/5   ? Right Knee Extension 4+/5   ? Left Knee Flexion 4+/5   ? Left Knee Extension 4+/5   ?  ? Palpation  ? Patella mobility WFL   ?  ? Transfers  ? Comments good patellar alignment with sit <> stand   ?  ? Ambulation/Gait  ? Gait Comments good patellar alignment with ascending and descending stairs   ? ?  ?  ? ?  ? ? ? ? ? ? ? ? ? ? ? ? ? ?  Objective measurements completed on examination: See above findings.  ? ? ? ? ? ? ? ? ? ? ? ? ? ? PT Education - 09/24/21 0821   ? ? Education Details PT POC and HEP   ? Person(s) Educated Patient   ? Methods Explanation;Demonstration;Handout   ? Comprehension Returned demonstration;Verbalized understanding   ? ?  ?  ? ?  ? ? ? ? ? ? ? ? ? ? ? ? ? ? Plan - 09/24/21 0830   ? ? Clinical Impression Statement Pt is a 66 y/o female referred for Rt knee pain. Pt states pain has been decreasing since MD visit and only occasionally hurts with twisting movements. pt with good patellar alignment and mobiltity, 4+/5 hip and knee strength and good balance. Pt educated on HEP for continued eccentric strengthening. Pt with no further PT needs at this time. Eval only.   ? Personal Factors and Comorbidities Time since onset of injury/illness/exacerbation   ? Examination-Activity Limitations Other   twist  ? Examination-Participation Restrictions Community Activity   ? Stability/Clinical Decision Making Stable/Uncomplicated   ? Clinical Decision Making Low   ? Rehab Potential Good    ? PT Frequency One time visit   ? PT Treatment/Interventions Therapeutic exercise   ? PT Next Visit Plan eval only   ? PT Home Exercise Plan [email or web address]   ? Consulted and Agree with Plan of Care Patient   ? ?  ?  ? ?  ? ? ?Patient will benefit from skilled therapeutic intervention in order to improve the following deficits and impairments:  Pain, Decreased knowledge of precautions ? ?Visit Diagnosis: ?Acute pain of right knee - Plan: PT plan of care cert/re-cert ? ? ? ? ?Problem List ?Patient Active Problem List  ? Diagnosis Date Noted  ? Lichen sclerosus et atrophicus of the vulva 03/01/2021  ? Posterior vitreous detachment, left eye 12/20/2017  ? ? ?Sami Froh, PT ?09/24/2021, 8:34 AM ? ?[place] [facility] ?[address] [place] [address] ?[place], [place], [address] ?Phone: [phone]   Fax:  [phone] ? ?Name: Cynthia Dalton ?MRN: [id number] ?Date of Birth: [birth year] ? ? ?

## 2021-09-24 NOTE — Patient Instructions (Signed)
Access Code: [email or web address] ?URL: [email or web address] ?Date: 09/24/2021 ?Prepared by: Reggy Eye ? ?Exercises ?- Straight Leg Raise with External Rotation  - 1 x daily - 7 x weekly - 3 sets - 10 reps ?- Wall Squat  - 1 x daily - 7 x weekly - 1 sets - 10 reps - 10 seconds hold ?- Lateral Step Down  - 1 x daily - 7 x weekly - 3 sets - 10 reps ?- Forward Step Down  - 1 x daily - 7 x weekly - 3 sets - 10 reps ?

## 2021-10-21 ENCOUNTER — Other Ambulatory Visit: Payer: Self-pay | Admitting: Medical-Surgical

## 2022-02-02 ENCOUNTER — Encounter: Payer: Self-pay | Admitting: General Practice

## 2022-03-02 ENCOUNTER — Ambulatory Visit (INDEPENDENT_AMBULATORY_CARE_PROVIDER_SITE_OTHER): Payer: Medicare Other | Admitting: Medical-Surgical

## 2022-03-02 ENCOUNTER — Encounter: Payer: Self-pay | Admitting: Medical-Surgical

## 2022-03-02 VITALS — BP 134/84 | HR 69 | Temp 98.8°F | Ht 68.0 in | Wt 215.1 lb

## 2022-03-02 DIAGNOSIS — J019 Acute sinusitis, unspecified: Secondary | ICD-10-CM | POA: Diagnosis not present

## 2022-03-02 DIAGNOSIS — B9689 Other specified bacterial agents as the cause of diseases classified elsewhere: Secondary | ICD-10-CM | POA: Diagnosis not present

## 2022-03-02 MED ORDER — AMOXICILLIN-POT CLAVULANATE 875-125 MG PO TABS: 1.0000 | ORAL_TABLET | Freq: Two times a day (BID) | ORAL | 0 refills | Status: DC

## 2022-03-02 NOTE — Progress Notes (Signed)
Established Patient Office Visit  Subjective   Patient ID: Cynthia Dalton, female   DOB: 01/31/1956 Age: 66 y.o. MRN: [id number]   Chief Complaint  Patient presents with   Sinus Problem    X-12 days. Had a virtual visit with UC.    HPI Pleasant 66 year old female presenting today with sinus pain/pressure.  Notes that she has had about 12 days of symptoms and was seen by telehealth last week.  She was prescribed a nasal spray at that time but no other treatment.  Her symptoms have continued and now include ear pressure/pain, postnasal drip, and a cough that is productive of sputum however she swallows it and is not sure what it looks like.  Notes that deep breathing makes her cough.  No significant rhinorrhea or stuffiness.  She has tested 3 times for COVID all with negative results, last 1 yesterday.  Ran a low-grade fever for a few days however this resolved.  No chills, shortness of breath, chest pain, or GI symptoms.   Objective:    Vitals:   03/02/22 1032 03/02/22 1034  BP: (!) 150/70 134/84  Pulse: 70 69  Temp: 98.8 F (37.1 C)   Height: 5\' 8"  (1.727 m)   Weight: 215 lb 1.3 oz (97.6 kg)   SpO2: 99%   TempSrc: Oral   BMI (Calculated): 32.71     Physical Exam Vitals and nursing note reviewed.  Constitutional:      General: She is not in acute distress.    Appearance: Normal appearance. She is not ill-appearing.  HENT:     Head: Normocephalic and atraumatic.     Ears:     Comments: Bilateral canal erythema. TMs intact with cloudy effusion bilaterally.    Nose: Nose normal.     Right Turbinates: Swollen.     Left Turbinates: Swollen.     Right Sinus: No maxillary sinus tenderness or frontal sinus tenderness.     Left Sinus: No maxillary sinus tenderness or frontal sinus tenderness.     Mouth/Throat:     Lips: Pink.     Mouth: Mucous membranes are moist.     Pharynx: Uvula midline. Oropharyngeal exudate (Clear) and posterior oropharyngeal erythema (Mild) present.      Tonsils: Tonsillar exudate (Clear) present. No tonsillar abscesses. 2+ on the right. 2+ on the left.  Cardiovascular:     Rate and Rhythm: Normal rate and regular rhythm.     Pulses: Normal pulses.     Heart sounds: Normal heart sounds.  Pulmonary:     Effort: Pulmonary effort is normal. No respiratory distress.     Breath sounds: Normal breath sounds. No wheezing, rhonchi or rales.  Skin:    General: Skin is warm and dry.  Neurological:     Mental Status: She is alert and oriented to person, place, and time.  Psychiatric:        Mood and Affect: Mood normal.        Behavior: Behavior normal.        Thought Content: Thought content normal.        Judgment: Judgment normal.   No results found for this or any previous visit (from the past 24 hour(s)).     The 10-year ASCVD risk score (Arnett DK, et al., 2019) is: 7.1%   Values used to calculate the score:     Age: 38 years     Sex: Female     Is Non-Hispanic African American: No     Diabetic:  No     Tobacco smoker: No     Systolic Blood Pressure: 462 mmHg     Is BP treated: No     HDL Cholesterol: 68 mg/dL     Total Cholesterol: 208 mg/dL   Assessment & Plan:   1. Acute bacterial sinusitis With 12 days of symptoms and continued issues with significant postnasal drip that is exacerbating her cough, we will go and treat for acute bacterial sinusitis with Augmentin twice daily x7 days.  Discussed conservative measures to use at home to help with symptoms.  Recommend starting Flonase to help with bilateral ear effusions and associated pressure/pain. - amoxicillin-clavulanate (AUGMENTIN) 875-125 MG tablet; Take 1 tablet by mouth 2 (two) times daily.  Dispense: 14 tablet; Refill: 0  Return if symptoms worsen or fail to improve.  ___________________________________________ Clearnce Sorrel, DNP, APRN, FNP-BC [facility] [place]

## 2022-03-03 ENCOUNTER — Other Ambulatory Visit: Payer: Self-pay | Admitting: Medical-Surgical

## 2022-03-03 DIAGNOSIS — Z1231 Encounter for screening mammogram for malignant neoplasm of breast: Secondary | ICD-10-CM

## 2022-03-24 ENCOUNTER — Ambulatory Visit: Payer: 59

## 2022-03-28 ENCOUNTER — Ambulatory Visit (INDEPENDENT_AMBULATORY_CARE_PROVIDER_SITE_OTHER): Payer: Medicare Other | Admitting: Obstetrics and Gynecology

## 2022-03-28 ENCOUNTER — Encounter: Payer: Self-pay | Admitting: Obstetrics and Gynecology

## 2022-03-28 VITALS — BP 124/82 | HR 66 | Resp 16 | Ht 68.0 in | Wt 215.0 lb

## 2022-03-28 DIAGNOSIS — N904 Leukoplakia of vulva: Secondary | ICD-10-CM

## 2022-03-28 MED ORDER — CLOBETASOL PROPIONATE 0.05 % EX OINT: TOPICAL_OINTMENT | CUTANEOUS | 5 refills | Status: DC

## 2022-03-28 NOTE — Progress Notes (Signed)
   GYNECOLOGY OFFICE FOLLOW UP NOTE  History:  66 y.o. [id number] here today for follow up for presumed lichen sclerosis. She is doing well, states she feels like she knows something is down there but most of the time not bothered. She will forget to use medication then use it daily until it improves. No other complaints.    History reviewed. No pertinent past medical history.  Past Surgical History:  Procedure Laterality Date   APPENDECTOMY  2000   TENNIS ELBOW RELEASE/NIRSCHEL PROCEDURE  2005   TONSILECTOMY/ADENOIDECTOMY WITH MYRINGOTOMY       Current Outpatient Medications:    clobetasol ointment (TEMOVATE) 0.05 %, Apply to affected area 2 x weekly., Disp: 30 g, Rfl: 5   ibuprofen (ADVIL) 200 MG tablet, Take 200 mg by mouth every 6 (six) hours as needed., Disp: , Rfl:   The following portions of the patient's history were reviewed and updated as appropriate: allergies, current medications, past family history, past medical history, past social history, past surgical history and problem list.   Review of Systems:  Pertinent items noted in HPI and remainder of comprehensive ROS otherwise negative.   Objective:  Physical Exam BP 124/82   Pulse 66   Resp 16   Ht 5\' 8"  (1.727 m)   Wt 215 lb (97.5 kg)   BMI 32.69 kg/m  CONSTITUTIONAL: Well-developed, well-nourished female in no acute distress.  HENT:  Normocephalic, atraumatic. External right and left ear normal. Oropharynx is clear and moist EYES: Conjunctivae and EOM are normal. Pupils are equal, round, and reactive to light. No scleral icterus.  NECK: Normal range of motion, supple, no masses SKIN: Skin is warm and dry. No rash noted. Not diaphoretic. No erythema. No pallor. NEUROLOGIC: Alert and oriented to person, place, and time. Normal reflexes, muscle tone coordination. No cranial nerve deficit noted. PSYCHIATRIC: Normal mood and affect. Normal behavior. Normal judgment and thought content. CARDIOVASCULAR: Normal heart rate  noted RESPIRATORY: Effort normal, no problems with respiration noted ABDOMEN: Soft, no distention noted.   PELVIC: Normal appearing external genitalia; slight erythema at introitus. Skin moist and appears well. No abnormal discharge noted.   MUSCULOSKELETAL: Normal range of motion. No edema noted.  Exam done with chaperone present.  Labs and Imaging No results found.  Assessment & Plan:  1. Lichen sclerosus et atrophicus of the vulva Doing well No new spots or worsening Encouraged regular use of clobetasol   Routine preventative health maintenance measures emphasized. Please refer to After Visit Summary for other counseling recommendations.   Return in about 6 months (around 09/27/2022).  Feliz Beam, MD, [place] for [employer] [facility])

## 2022-03-31 ENCOUNTER — Ambulatory Visit (INDEPENDENT_AMBULATORY_CARE_PROVIDER_SITE_OTHER): Payer: Medicare Other

## 2022-03-31 DIAGNOSIS — Z1231 Encounter for screening mammogram for malignant neoplasm of breast: Secondary | ICD-10-CM

## 2022-04-15 ENCOUNTER — Ambulatory Visit
Admission: RE | Admit: 2022-04-15 | Discharge: 2022-04-15 | Disposition: A | Discharge status: Home / Self Care | Payer: Medicare Other | Source: Ambulatory Visit | Attending: Family Medicine | Admitting: Family Medicine

## 2022-04-15 VITALS — BP 141/82 | HR 66 | Temp 98.1°F | Resp 14

## 2022-04-15 DIAGNOSIS — K122 Cellulitis and abscess of mouth: Secondary | ICD-10-CM

## 2022-04-15 MED ORDER — AZITHROMYCIN 250 MG PO TABS: 250.0000 mg | ORAL_TABLET | Freq: Every day | ORAL | 0 refills | Status: DC

## 2022-04-15 MED ORDER — TRIAMCINOLONE ACETONIDE 0.1 % MT PSTE: 1.0000 | PASTE | Freq: Two times a day (BID) | OROMUCOSAL | 1 refills | Status: DC

## 2022-04-15 NOTE — ED Triage Notes (Addendum)
Sore throat since Wednesday  Pt burned the roof of her mouth on her chili on Tuesday & has had pain since then  Per pt she has a blister from the chili OTC  ibuprofen  at 0630 (600mg ) - helps w/ pain Pain is only on the right side w/ ear pain  Tonsillectomy at age 66

## 2022-04-15 NOTE — Discharge Instructions (Addendum)
Instructed patient to take medication as directed with food to completion.  Advised may use dental paste daily or as needed for painful mouth ulcers.  Encouraged patient to increase daily water intake to 64 ounces per day while taking these medications.  Advised if symptoms worsen and/or unresolved please follow-up with PCP or here for further evaluation.

## 2022-04-15 NOTE — ED Provider Notes (Signed)
[name]    CSN: [id number] Arrival date & time: 04/15/22  1117      History   Chief Complaint Chief Complaint  Patient presents with   Sore Throat    HPI Cynthia Dalton is a 66 y.o. female.   HPI Pleasant 66 year old female presents with sore throat since Wednesday morning.  Patient reports burning the roof of her mouth with chili on Tuesday and has mouth pain since then reports there is pain from formed blister. Patient reports of right ear pain PMH significant for obesity and history of colonic polyps.  History reviewed. No pertinent past medical history.  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 Authorizing Provider  azithromycin (ZITHROMAX) 250 MG tablet Take 1 tablet (250 mg total) by mouth daily. Take first 2 tablets together, then 1 every day until finished. 04/15/22  Yes Eliezer Lofts, FNP  triamcinolone (KENALOG) 0.1 % paste Use as directed 1 Application in the mouth or throat 2 (two) times daily. Advised patient may apply to affected areas of upper mouth/hard palate twice daily for 5 days 04/15/22  Yes Eliezer Lofts, FNP  clobetasol ointment (TEMOVATE) 0.05 % Apply to affected area 2 x weekly. 03/28/22   Sloan Leiter, MD  ibuprofen (ADVIL) 200 MG tablet Take 200 mg by mouth every 6 (six) hours as needed.    [provider]    Family History Family History  Problem Relation Age of Onset   High  blood pressure Mother    High Cholesterol Mother    Dementia Mother    Alzheimer's disease Father     Social History Social History   Tobacco Use   Smoking status: Never   Smokeless tobacco: Never  Vaping Use   Vaping Use: Never used  Substance Use Topics   Alcohol use: Yes    Alcohol/week: 8.0 standard drinks of alcohol    Types: 8 Standard drinks or equivalent per week   Drug use: Never     Allergies   Patient has no known allergies.   Review of Systems Review of Systems  HENT:  Positive for congestion and postnasal drip.   All other systems reviewed and are negative.    Physical Exam Triage Vital Signs ED Triage Vitals  Enc Vitals Group     BP 04/15/22 1125 (!) 141/82     Pulse Rate 04/15/22 1125 66     Resp 04/15/22 1125 14     Temp 04/15/22 1125 98.1 F (36.7 C)     Temp Source 04/15/22 1125 Oral     SpO2 04/15/22 1125 100 %     Weight --  Height --      Head Circumference --      Peak Flow --      Pain Score 04/15/22 1128 4     Pain Loc --      Pain Edu? --      Excl. in [place]? --    No data found.  Updated Vital Signs BP (!) 141/82 (BP Location: Left Arm)   Pulse 66   Temp 98.1 F (36.7 C) (Oral)   Resp 14   SpO2 100%    Physical Exam Vitals and nursing note reviewed.  Constitutional:      Appearance: She is well-developed and normal weight.  HENT:     Head: Normocephalic and atraumatic.     Right Ear: Tympanic membrane and ear canal normal.     Left Ear: Tympanic membrane and ear canal normal.     Mouth/Throat:     Mouth: Mucous membranes are moist.     Pharynx: Oropharynx is clear. Uvula midline. Posterior oropharyngeal erythema and uvula swelling present.      Comments: Hard palate: Multiple tiny oval blisters on erythematous bases Eyes:     Conjunctiva/sclera: Conjunctivae normal.     Pupils: Pupils are equal, round, and reactive to light.  Cardiovascular:     Rate and Rhythm: Normal rate and regular rhythm.     Heart  sounds: Normal heart sounds. No murmur heard. Pulmonary:     Effort: Pulmonary effort is normal.     Breath sounds: Normal breath sounds.  Musculoskeletal:     Cervical back: Normal range of motion and neck supple.  Skin:    General: Skin is warm and dry.  Neurological:     Mental Status: She is alert.      UC Treatments / Results  Labs (all labs ordered are listed, but only abnormal results are displayed) Labs Reviewed - No data to display  EKG   Radiology No results found.  Procedures Procedures (including critical care time)  Medications Ordered in UC Medications - No data to display  Initial Impression / Assessment and Plan / UC Course  I have reviewed the triage vital signs and the nursing notes.  Pertinent labs & imaging results that were available during my care of the patient were reviewed by me and considered in my medical decision making (see chart for details).     MDM: 1.  Uvulitis-Rx'd Zithromax, Triamcinolone dental paste. Instructed patient to take medication as directed with food to completion.  Advised may use dental paste daily or as needed for painful mouth ulcers.  Encouraged patient to increase daily water intake to 64 ounces per day while taking these medications.  Advised if symptoms worsen and/or unresolved please follow-up with PCP or here for further evaluation.  Discharged home, hemodynamically stable.  Patient discharged home, hemodynamically stable. Final Clinical Impressions(s) / UC Diagnoses   Final diagnoses:  Uvulitis     Discharge Instructions      Instructed patient to take medication as directed with food to completion.  Advised may use dental paste daily or as needed for painful mouth ulcers.  Encouraged patient to increase daily water intake to 64 ounces per day while taking these medications.  Advised if symptoms worsen and/or unresolved please follow-up with PCP or here for further evaluation.     ED Prescriptions      Medication Sig Dispense Auth. Provider   triamcinolone (KENALOG) 0.1 % paste Use as directed 1 Application in the mouth or throat 2 (  two) times daily. Advised patient may apply to affected areas of upper mouth/hard palate twice daily for 5 days 5 g Eliezer Lofts, FNP   azithromycin (ZITHROMAX) 250 MG tablet Take 1 tablet (250 mg total) by mouth daily. Take first 2 tablets together, then 1 every day until finished. 6 tablet Eliezer Lofts, FNP      PDMP not reviewed this encounter.   Eliezer Lofts, [place] 04/15/22 1244

## 2022-04-29 ENCOUNTER — Ambulatory Visit (INDEPENDENT_AMBULATORY_CARE_PROVIDER_SITE_OTHER): Payer: Medicare Other | Admitting: Medical-Surgical

## 2022-04-29 VITALS — BP 129/72 | HR 68 | Ht 68.0 in | Wt 215.1 lb

## 2022-04-29 DIAGNOSIS — Z0000 Encounter for general adult medical examination without abnormal findings: Secondary | ICD-10-CM

## 2022-04-29 NOTE — Progress Notes (Signed)
MEDICARE ANNUAL WELLNESS VISIT  04/29/2022  Subjective:  Cynthia Dalton is a 66 y.o. female patient of Cynthia Dalton, Joy, NP who had a Medicare Annual Wellness Visit today. Britta MccreedyBarbara is Retired and lives with their spouse. she has 2 children. she reports that she is socially active and does interact with friends/family regularly. she is moderately physically active and enjoys read, cook, doing outdoor activities, and doing crossword puzzles.  Patient Care Team: Cynthia Dalton, Joy, NP as PCP - General (Nurse Practitioner)     04/29/2022    8:04 AM 11/03/2020    1:47 PM  Advanced Directives  Does Patient Have a Medical Advance Directive? Yes Yes  Type of Advance Directive Living will Healthcare Power of [place];Living will  Does patient want to make changes to medical advance directive? No - Patient declined No - Patient declined  Copy of Healthcare Power of Attorney in Chart?  No - copy requested    Hospital Utilization Over the Past 12 Months: # of hospitalizations or ER visits: 0 # of surgeries: 0  Review of Systems    Patient reports that her overall health is unchanged when compared to last year.  Review of Systems: History obtained from chart review and the patient  All other systems negative.  Pain Assessment Pain : No/denies pain     Current Medications & Allergies (verified) Allergies as of 04/29/2022   No Known Allergies      Medication List        Accurate as of April 29, 2022  8:23 AM. If you have any questions, ask your nurse or doctor.          azithromycin 250 MG tablet Commonly known as: ZITHROMAX Take 1 tablet (250 mg total) by mouth daily. Take first 2 tablets together, then 1 every day until finished.   clobetasol ointment 0.05 % Commonly known as: TEMOVATE Apply to affected area 2 x weekly.   ibuprofen 200 MG tablet Commonly known as: ADVIL Take 200 mg by mouth every 6 (six) hours as needed.   triamcinolone 0.1 % paste Commonly known as:  KENALOG Use as directed 1 Application in the mouth or throat 2 (two) times daily. Advised patient may apply to affected areas of upper mouth/hard palate twice daily for 5 days        History (reviewed): History reviewed. No pertinent past medical history. Past Surgical History:  Procedure Laterality Date   APPENDECTOMY  2000   TENNIS ELBOW RELEASE/NIRSCHEL PROCEDURE  2005   TONSILLECTOMY     Family History  Problem Relation Age of Onset   High blood pressure Mother    High Cholesterol Mother    Dementia Mother    Arthritis Mother    Hypertension Mother    Alzheimer's disease Father    Asthma Father    Social History   Socioeconomic History   Marital status: Married    Spouse name: Chrissie Dalton   Number of children: 2   Years of education: 16   Highest education level: Bachelor's degree (e.g., BA, AB, BS)  Occupational History   Occupation: retired  Tobacco Use   Smoking status: Never   Smokeless tobacco: Never  Vaping Use   Vaping Use: Never used  Substance and Sexual Activity   Alcohol use: Yes    Alcohol/week: 8.0 standard drinks of alcohol    Types: 8 Glasses of wine per week   Drug use: Never   Sexual activity: Yes    Partners: Male    Birth control/protection: Post-menopausal,  None  Other Topics Concern   Not on file  Social History Narrative   Lives with her husband. She has two children. She likes to read, cook, doing outdoor activities, and doing crossword puzzles.   Social Determinants of Health   Financial Resource Strain: Low Risk  (04/28/2022)   Overall Financial Resource Strain (CARDIA)    Difficulty of Paying Living Expenses: Not hard at all  Food Insecurity: No Food Insecurity (04/28/2022)   Hunger Vital Sign    Worried About Running Out of Food in the Last Year: Never true    Ran Out of Food in the Last Year: Never true  Transportation Needs: No Transportation Needs (04/28/2022)   PRAPARE - Administrator, Civil Service (Medical):  No    Lack of Transportation (Non-Medical): No  Physical Activity: Sufficiently Active (04/28/2022)   Exercise Vital Sign    Days of Exercise per Week: 4 days    Minutes of Exercise per Session: 40 min  Stress: No Stress Concern Present (04/28/2022)   [employer] of Occupational Health - Occupational Stress Questionnaire    Feeling of Stress : Not at all  Social Connections: Socially Integrated (04/29/2022)   Social Connection and Isolation Panel [NHANES]    Frequency of Communication with Friends and Family: More than three times a week    Frequency of Social Gatherings with Friends and Family: Twice a week    Attends Religious Services: More than 4 times per year    Active Member of [employer] or Organizations: Yes    Attends Banker Meetings: More than 4 times per year    Marital Status: Married    Activities of Daily Living    04/28/2022    9:45 AM  In your present state of health, do you have any difficulty performing the following activities:  Hearing? 0  Vision? 0  Difficulty concentrating or making decisions? 0  Walking or climbing stairs? 0  Dressing or bathing? 0  Doing errands, shopping? 0  Preparing Food and eating ? N  Using the Toilet? N  In the past six months, have you accidently leaked urine? N  Do you have problems with loss of bowel control? N  Managing your Medications? N  Managing your Finances? N  Housekeeping or managing your Housekeeping? N    Patient Education/Literacy How often do you need to have someone help you when you read instructions, pamphlets, or other written materials from your doctor or pharmacy?: 1 - Never What is the last grade level you completed in school?: Bachelor's degree  Exercise Current Exercise Habits: Structured exercise class, Type of exercise: walking;strength training/weights, Time (Minutes): 30, Frequency (Times/Week): 2, Weekly Exercise (Minutes/Week): 60, Intensity: Moderate, Exercise limited by: None  identified  Diet Patient reports consuming 3 meals a day and 2 snack(s) a day Patient reports that her primary diet is: Regular Patient reports that she does have regular access to food.   Depression Screen    04/29/2022    8:05 AM 03/02/2022   10:35 AM 09/13/2021    3:28 PM 08/18/2021    9:18 AM 11/03/2020    1:48 PM 10/22/2018   11:14 AM  PHQ 2/9 Scores  PHQ - 2 Score 0 0 0 0 0 0     Fall Risk    04/29/2022    8:05 AM 04/28/2022    9:45 AM 03/02/2022   10:35 AM 09/13/2021    3:28 PM 08/18/2021    9:18 AM  Fall  Risk   Falls in the past year? 0 0 0 0 0  Number falls in past yr: 0  0 0 0  Injury with Fall? 0  0 0 0  Risk for fall due to : No Fall Risks  No Fall Risks No Fall Risks No Fall Risks  Follow up Falls evaluation completed  Falls evaluation completed Falls evaluation completed Falls evaluation completed     Objective:   BP 129/72 (BP Location: Right Arm, Patient Position: Sitting, Cuff Size: Normal)   Pulse 68   Ht 5\' 8"  (1.727 m)   Wt 215 lb 1.9 oz (97.6 kg)   SpO2 98%   BMI 32.71 kg/m   Last Weight  Most recent update: 04/29/2022  8:03 AM    Weight  97.6 kg (215 lb 1.9 oz)             Body mass index is 32.71 kg/m.  Hearing/Vision  Lynett did not have difficulty with hearing/understanding during the face-to-face interview Rosalene did not have difficulty with her vision during the face-to-face interview Reports that she has not had a formal eye exam by an eye care professional within the past year Reports that she has not had a formal hearing evaluation within the past year  Cognitive Function:    04/29/2022    8:08 AM 11/03/2020    1:56 PM  6CIT Screen  What Year? 0 points 0 points  What month? 0 points 0 points  What time? 0 points 0 points  Count back from 20 0 points 0 points  Months in reverse 0 points 0 points  Repeat phrase 0 points 0 points  Total Score 0 points 0 points    Normal Cognitive Function Screening: Yes (Normal:0-7,  Significant for Dysfunction: >8)  Immunization & Health Maintenance Record Immunization History  Administered Date(s) Administered   Fluad Quad(high Dose 65+) 03/23/2021   Influenza, High Dose Seasonal PF 03/24/2022   Influenza,inj,Quad PF,6+ Mos 04/05/2018, 04/02/2019   Influenza-Unspecified 03/24/2020   Moderna Covid-19 Vaccine Bivalent Booster 56yrs & up 03/24/2022   PFIZER(Purple Top)SARS-COV-2 Vaccination 07/15/2019, 08/11/2019, 03/13/2020, 11/02/2020   Pneumococcal Conjugate-13 08/18/2021   Zoster Recombinat (Shingrix) 10/29/2018, 01/03/2019    Health Maintenance  Topic Date Due   TETANUS/TDAP  03/03/2023 (Originally 06/17/1974)   Hepatitis C Screening  04/30/2023 (Originally 06/17/1973)   COVID-19 Vaccine (6 - Pfizer series) 07/25/2022   Pneumonia Vaccine 42+ Years old (2 - PPSV23 or PCV20) 08/19/2022   Medicare Annual Wellness (AWV)  04/30/2023   MAMMOGRAM  03/31/2024   COLONOSCOPY (Pts 45-16yrs Insurance coverage will need to be confirmed)  02/26/2031   INFLUENZA VACCINE  Completed   DEXA SCAN  Completed   Zoster Vaccines- Shingrix  Completed   HPV VACCINES  Aged Out       Assessment  This is a routine wellness examination for 02/28/2031.  Health Maintenance: Due or Overdue There are no preventive care reminders to display for this patient.   Laila Myhre does not need a referral for Community Assistance: Care Management:   no Social Work:    no Prescription Assistance:  no Nutrition/Diabetes Education:  no   Plan:  Personalized Goals  Goals Addressed               This Visit's Progress     Patient Stated (pt-stated)        Patient would like to loose 40 lbs.       Personalized Health Maintenance & Screening Recommendations  Td  vaccine  Lung Cancer Screening Recommended: no (Low Dose CT Chest recommended if Age 44-80 years, 30 pack-year currently smoking OR have quit w/in past 15 years) Hepatitis C Screening recommended: yes HIV  Screening recommended: no  Advanced Directives: Written information was not given per the patient's request.  Referrals & Orders No orders of the defined types were placed in this encounter.   Follow-up Plan Follow-up with Cynthia Butter, NP as planned Schedule the tetanus vaccine at the pharmacy. Medicare wellness visit in one year.  AVS printed and given to the patient.   I have personally reviewed and noted the following in the patient's chart:   Medical and social history Use of alcohol, tobacco or illicit drugs  Current medications and supplements Functional ability and status Nutritional status Physical activity Advanced directives List of other physicians Hospitalizations, surgeries, and ER visits in previous 12 months Vitals Screenings to include cognitive, depression, and falls Referrals and appointments  In addition, I have reviewed and discussed with patient certain preventive protocols, quality metrics, and best practice recommendations. A written personalized care plan for preventive services as well as general preventive health recommendations were provided to patient.     Modesto Charon, RN BSN  04/29/2022

## 2022-04-29 NOTE — Patient Instructions (Addendum)
MEDICARE ANNUAL WELLNESS VISIT Health Maintenance Summary and Written Plan of Care  Cynthia Dalton ,  Thank you for allowing me to perform your Medicare Annual Wellness Visit and for your ongoing commitment to your health.   Health Maintenance & Immunization History Health Maintenance  Topic Date Due   TETANUS/TDAP  03/03/2023 (Originally 06/17/1974)   Hepatitis C Screening  04/30/2023 (Originally 06/17/1973)   COVID-19 Vaccine (6 - Pfizer series) 07/25/2022   Pneumonia Vaccine 59+ Years old (2 - PPSV23 or PCV20) 08/19/2022   Medicare Annual Wellness (AWV)  04/30/2023   MAMMOGRAM  03/31/2024   COLONOSCOPY (Pts 45-64yrs Insurance coverage will need to be confirmed)  02/26/2031   INFLUENZA VACCINE  Completed   DEXA SCAN  Completed   Zoster Vaccines- Shingrix  Completed   HPV VACCINES  Aged Out   Immunization History  Administered Date(s) Administered   Fluad Quad(high Dose 65+) 03/23/2021   Influenza, High Dose Seasonal PF 03/24/2022   Influenza,inj,Quad PF,6+ Mos 04/05/2018, 04/02/2019   Influenza-Unspecified 03/24/2020   Moderna Covid-19 Vaccine Bivalent Booster 34yrs & up 03/24/2022   PFIZER(Purple Top)SARS-COV-2 Vaccination 07/15/2019, 08/11/2019, 03/13/2020, 11/02/2020   Pneumococcal Conjugate-13 08/18/2021   Zoster Recombinat (Shingrix) 10/29/2018, 01/03/2019    These are the patient goals that we discussed:  Goals Addressed               This Visit's Progress     Patient Stated (pt-stated)        Patient would like to loose 40 lbs.         This is a list of Health Maintenance Items that are overdue or due now: Td vaccine    Orders/Referrals Placed Today: No orders of the defined types were placed in this encounter.  (Contact our referral department at [phone] if you have not spoken with someone about your referral appointment within the next 5 days)    Follow-up Plan Follow-up with Christen Butter, NP as planned Schedule the tetanus vaccine at the  pharmacy. Medicare wellness visit in one year.  AVS printed and given to the patient.      Health Maintenance, Female Adopting a healthy lifestyle and getting preventive care are important in promoting health and wellness. Ask your health care provider about: The right schedule for you to have regular tests and exams. Things you can do on your own to prevent diseases and keep yourself healthy. What should I know about diet, weight, and exercise? Eat a healthy diet  Eat a diet that includes plenty of vegetables, fruits, low-fat dairy products, and lean protein. Do not eat a lot of foods that are high in solid fats, added sugars, or sodium. Maintain a healthy weight Body mass index (BMI) is used to identify weight problems. It estimates body fat based on height and weight. Your health care provider can help determine your BMI and help you achieve or maintain a healthy weight. Get regular exercise Get regular exercise. This is one of the most important things you can do for your health. Most adults should: Exercise for at least 150 minutes each week. The exercise should increase your heart rate and make you sweat (moderate-intensity exercise). Do strengthening exercises at least twice a week. This is in addition to the moderate-intensity exercise. Spend less time sitting. Even light physical activity can be beneficial. Watch cholesterol and blood lipids Have your blood tested for lipids and cholesterol at 67 years of age, then have this test every 5 years. Have your cholesterol levels checked more  often if: Your lipid or cholesterol levels are high. You are older than 67 years of age. You are at high risk for heart disease. What should I know about cancer screening? Depending on your health history and family history, you may need to have cancer screening at various ages. This may include screening for: Breast cancer. Cervical cancer. Colorectal cancer. Skin cancer. Lung cancer. What  should I know about heart disease, diabetes, and high blood pressure? Blood pressure and heart disease High blood pressure causes heart disease and increases the risk of stroke. This is more likely to develop in people who have high blood pressure readings or are overweight. Have your blood pressure checked: Every 3-5 years if you are 25-20 years of age. Every year if you are 24 years old or older. Diabetes Have regular diabetes screenings. This checks your fasting blood sugar level. Have the screening done: Once every three years after age 57 if you are at a normal weight and have a low risk for diabetes. More often and at a younger age if you are overweight or have a high risk for diabetes. What should I know about preventing infection? Hepatitis B If you have a higher risk for hepatitis B, you should be screened for this virus. Talk with your health care provider to find out if you are at risk for hepatitis B infection. Hepatitis C Testing is recommended for: Everyone born from [birth year] through 1966. Anyone with known risk factors for hepatitis C. Sexually transmitted infections (STIs) Get screened for STIs, including gonorrhea and chlamydia, if: You are sexually active and are younger than 67 years of age. You are older than 67 years of age and your health care provider tells you that you are at risk for this type of infection. Your sexual activity has changed since you were last screened, and you are at increased risk for chlamydia or gonorrhea. Ask your health care provider if you are at risk. Ask your health care provider about whether you are at high risk for HIV. Your health care provider may recommend a prescription medicine to help prevent HIV infection. If you choose to take medicine to prevent HIV, you should first get tested for HIV. You should then be tested every 3 months for as long as you are taking the medicine. Pregnancy If you are about to stop having your period  (premenopausal) and you may become pregnant, seek counseling before you get pregnant. Take 400 to 800 micrograms (mcg) of folic acid every day if you become pregnant. Ask for birth control (contraception) if you want to prevent pregnancy. Osteoporosis and menopause Osteoporosis is a disease in which the bones lose minerals and strength with aging. This can result in bone fractures. If you are 62 years old or older, or if you are at risk for osteoporosis and fractures, ask your health care provider if you should: Be screened for bone loss. Take a calcium or vitamin D supplement to lower your risk of fractures. Be given hormone replacement therapy (HRT) to treat symptoms of menopause. Follow these instructions at home: Alcohol use Do not drink alcohol if: Your health care provider tells you not to drink. You are pregnant, may be pregnant, or are planning to become pregnant. If you drink alcohol: Limit how much you have to: 0-1 drink a day. Know how much alcohol is in your drink. In the [place] one drink equals one 12 oz bottle of beer (355 mL), one 5 oz glass of wine (148 mL),  or one 1 oz glass of hard liquor (44 mL). Lifestyle Do not use any products that contain nicotine or tobacco. These products include cigarettes, chewing tobacco, and vaping devices, such as e-cigarettes. If you need help quitting, ask your health care provider. Do not use street drugs. Do not share needles. Ask your health care provider for help if you need support or information about quitting drugs. General instructions Schedule regular health, dental, and eye exams. Stay current with your vaccines. Tell your health care provider if: You often feel depressed. You have ever been abused or do not feel safe at home. Summary Adopting a healthy lifestyle and getting preventive care are important in promoting health and wellness. Follow your health care provider's instructions about healthy diet, exercising, and getting  tested or screened for diseases. Follow your health care provider's instructions on monitoring your cholesterol and blood pressure. This information is not intended to replace advice given to you by your health care provider. Make sure you discuss any questions you have with your health care provider. Document Revised: 10/19/2020 Document Reviewed: 10/19/2020 Elsevier Patient Education  2023 [employer].

## 2022-06-25 IMAGING — MG DIGITAL SCREENING BILAT W/ TOMO W/ CAD
6 of 10 series · 6 of 30 positions shown · non-contrast
Comparison: Previous exam(s).

CLINICAL DATA: Screening.

EXAM:
DIGITAL SCREENING BILATERAL MAMMOGRAM WITH TOMO AND CAD

[R CC synth-2D]
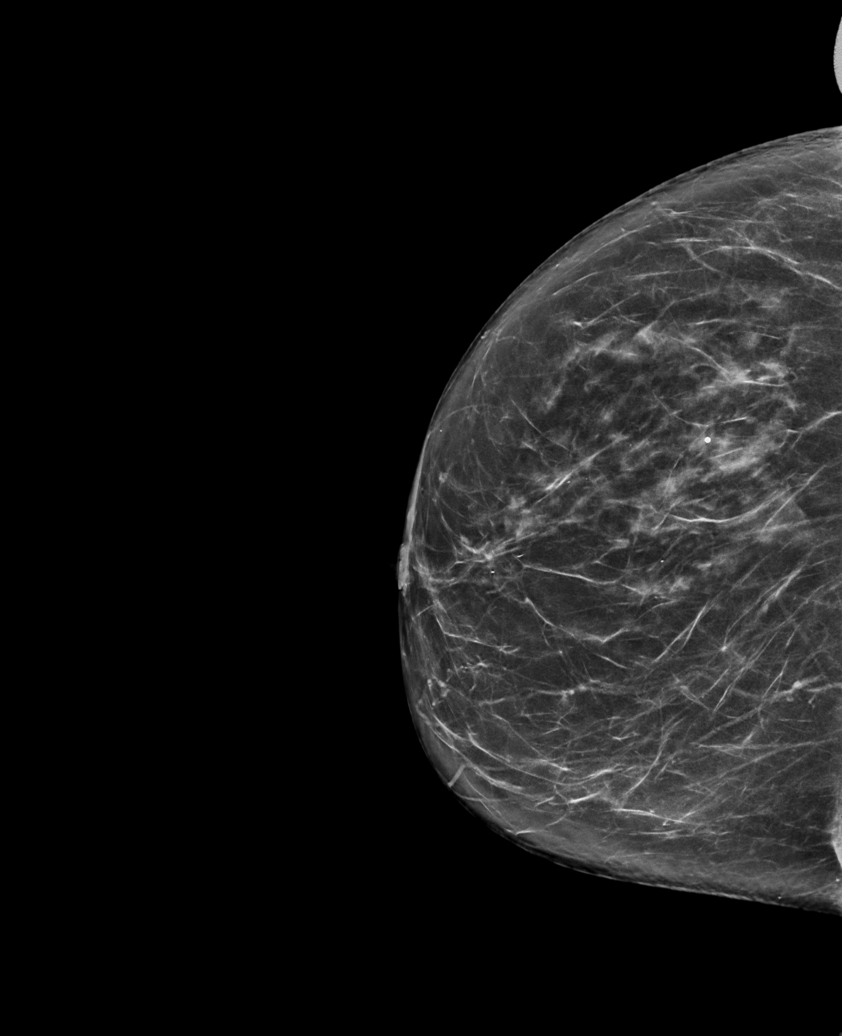

[L MLO synth-2D (1 of 2)]
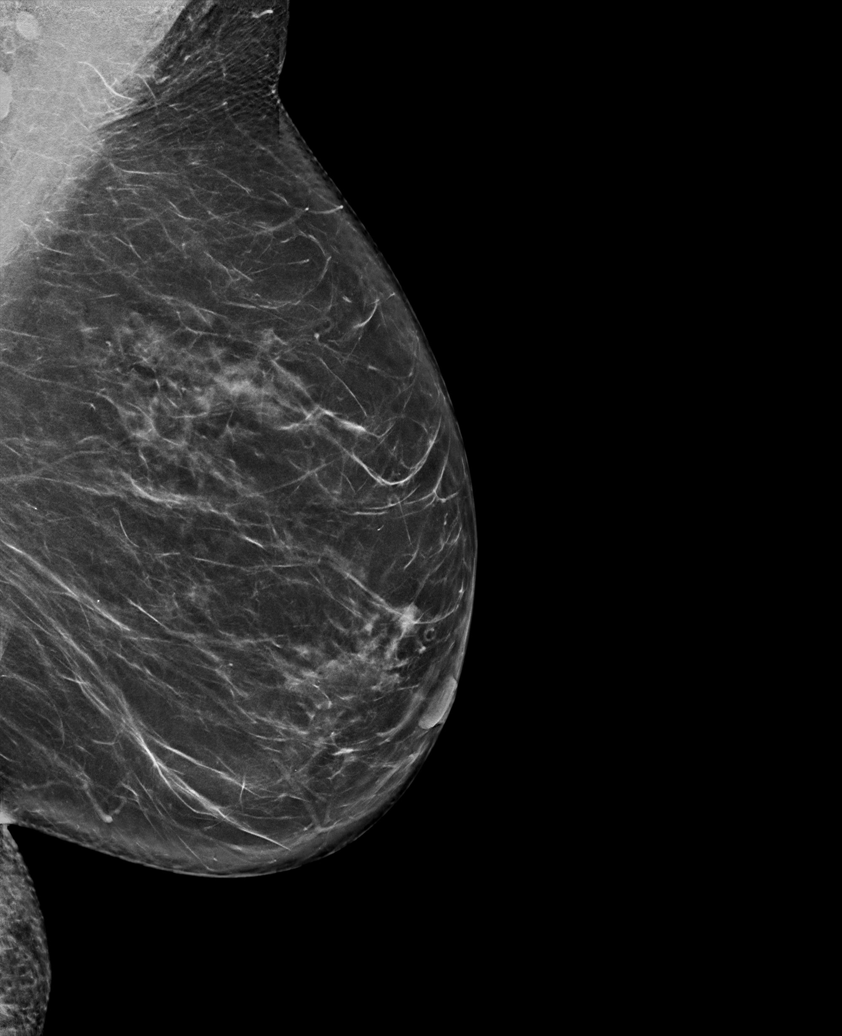

[L CC synth-2D]
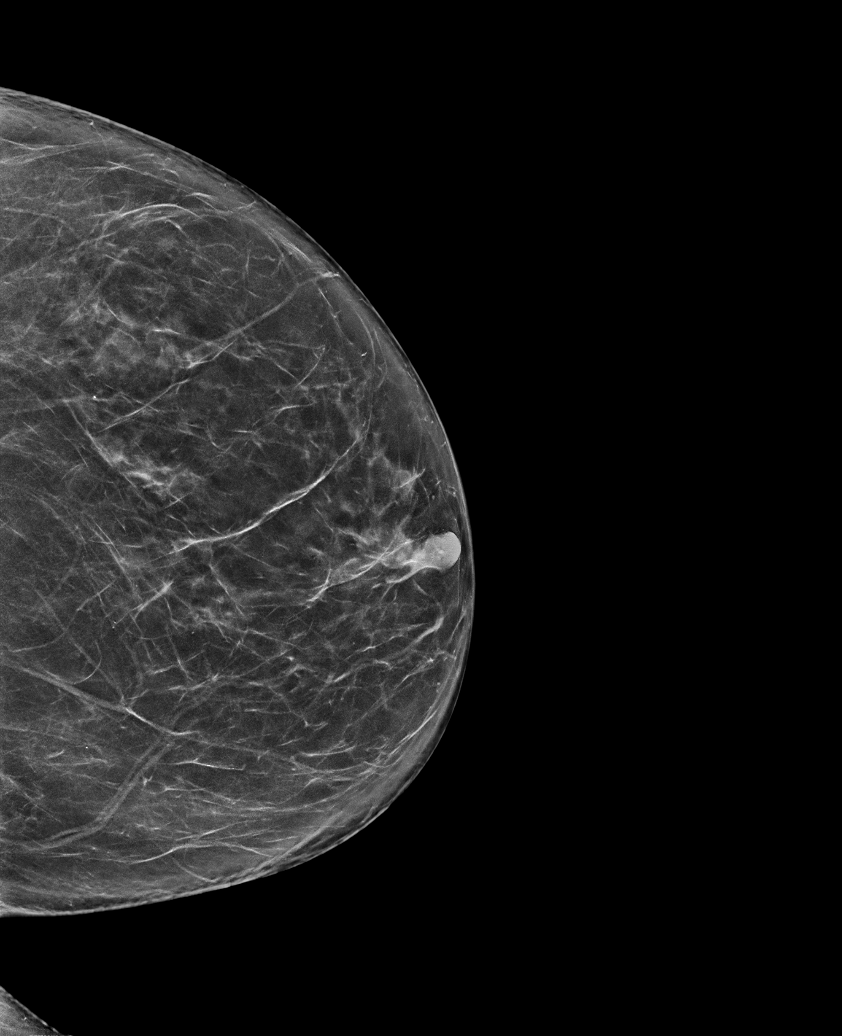

[R MLO synth-2D]
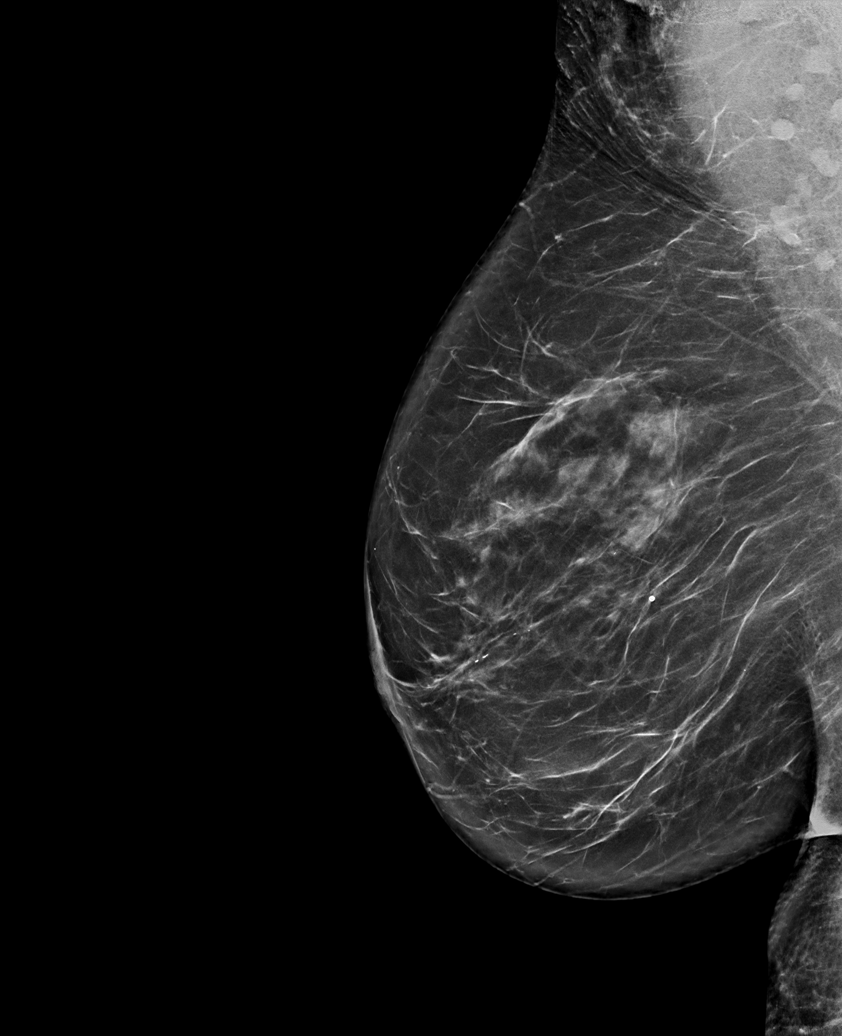

[L MLO synth-2D (2 of 2)]
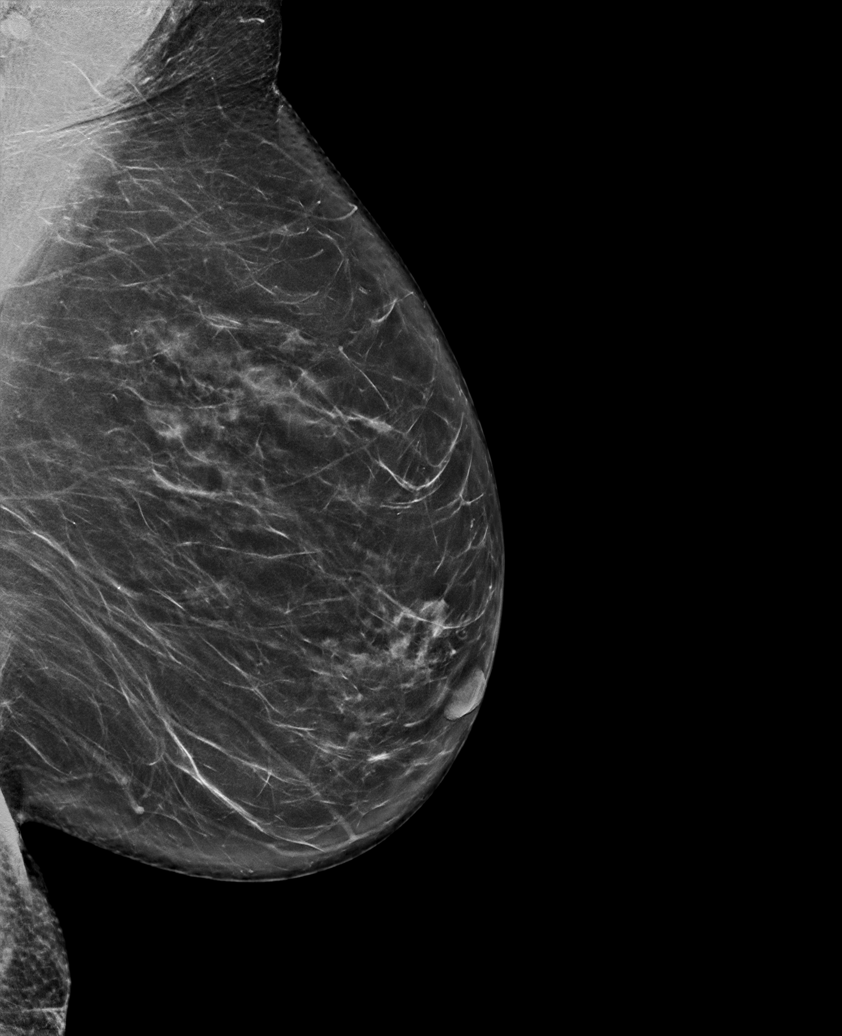

[L MLO tomo · tomo slice 40/79.0]
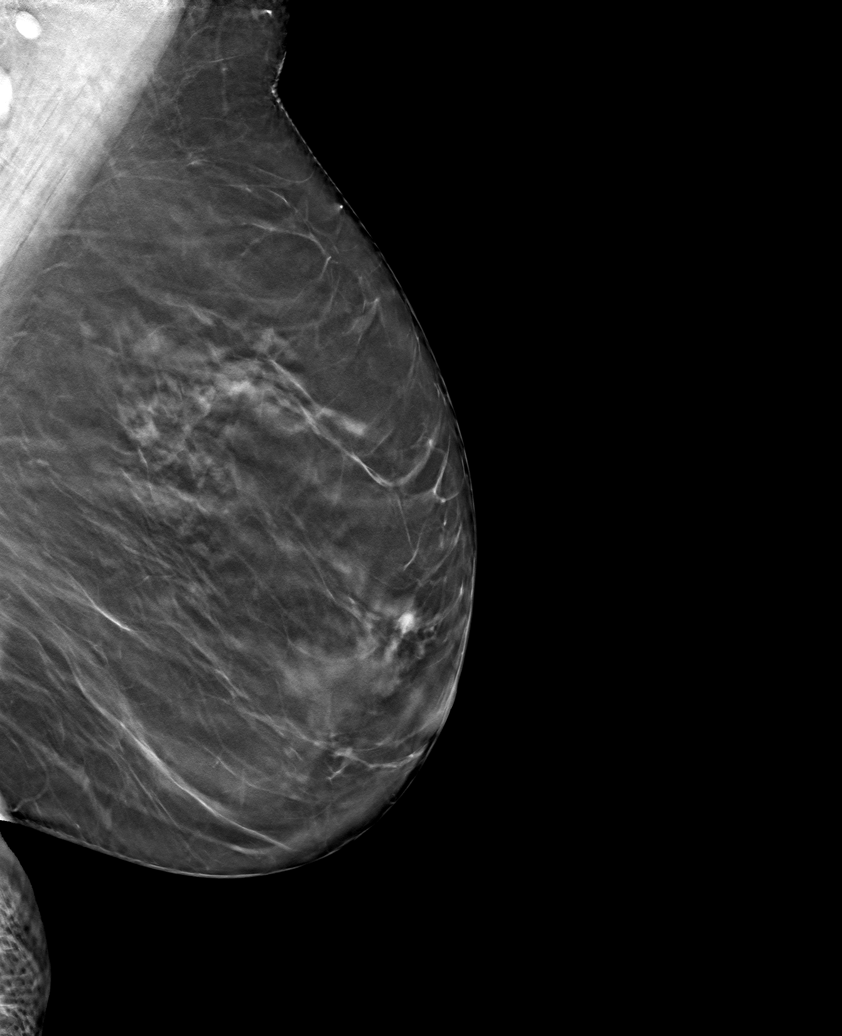

[6 of 30 positions shown; findings below may reference images not displayed]

ACR Breast Density Category b: There are scattered areas of
fibroglandular density.
FINDINGS: There are no findings suspicious for malignancy. Images were
processed with CAD.
IMPRESSION: No mammographic evidence of malignancy. A result letter of this
screening mammogram will be mailed directly to the patient.

RECOMMENDATION:
Screening mammogram in one year. (Code:[id number])

BI-RADS CATEGORY  1: Negative.

## 2022-07-06 ENCOUNTER — Encounter: Payer: Self-pay | Admitting: Medical-Surgical

## 2023-01-25 ENCOUNTER — Ambulatory Visit: Payer: Medicare Other | Admitting: Obstetrics and Gynecology

## 2023-01-25 ENCOUNTER — Encounter: Payer: Self-pay | Admitting: Obstetrics and Gynecology

## 2023-01-25 VITALS — BP 133/80 | HR 55 | Resp 16 | Ht 68.0 in | Wt 191.0 lb

## 2023-01-25 DIAGNOSIS — N904 Leukoplakia of vulva: Secondary | ICD-10-CM | POA: Diagnosis not present

## 2023-01-25 MED ORDER — CLOBETASOL PROPIONATE 0.05 % EX OINT: TOPICAL_OINTMENT | CUTANEOUS | 5 refills | Status: DC

## 2023-01-25 NOTE — Progress Notes (Signed)
   GYNECOLOGY OFFICE FOLLOW UP NOTE  History:  67 y.o. [id number] here today for follow up for lichen sclerosus. She is doing well, using clobetasol cream as needed, will use 2x/week for a little while, then stop. She is only using it symptomatically. Otherwise, no complaints.    History reviewed. No pertinent past medical history.  Past Surgical History:  Procedure Laterality Date   APPENDECTOMY  2000   TENNIS ELBOW RELEASE/NIRSCHEL PROCEDURE  2005   TONSILLECTOMY       Current Outpatient Medications:    chlorhexidine (PERIDEX) 0.12 % solution, SMARTSIG:By Mouth, Disp: , Rfl:    ibuprofen (ADVIL) 200 MG tablet, Take 200 mg by mouth every 6 (six) hours as needed., Disp: , Rfl:    clobetasol ointment (TEMOVATE) 0.05 %, Apply to affected area 2 x weekly., Disp: 30 g, Rfl: 5  The following portions of the patient's history were reviewed and updated as appropriate: allergies, current medications, past family history, past medical history, past social history, past surgical history and problem list.   Review of Systems:  Pertinent items noted in HPI and remainder of comprehensive ROS otherwise negative.   Objective:  Physical Exam BP 133/80   Pulse (!) 55   Resp 16   Ht 5\' 8"  (1.727 m)   Wt 86.6 kg   BMI 29.04 kg/m  CONSTITUTIONAL: Well-developed, well-nourished female in no acute distress.  HENT:  Normocephalic, atraumatic. External right and left ear normal. Oropharynx is clear and moist EYES: Conjunctivae and EOM are normal. Pupils are equal, round, and reactive to light. No scleral icterus.  NECK: Normal range of motion, supple, no masses SKIN: Skin is warm and dry. No rash noted. Not diaphoretic. No erythema. No pallor. NEUROLOGIC: Alert and oriented to person, place, and time. Normal reflexes, muscle tone coordination. No cranial nerve deficit noted. PSYCHIATRIC: Normal mood and affect. Normal behavior. Normal judgment and thought content. CARDIOVASCULAR: Normal heart rate  noted RESPIRATORY: Effort normal, no problems with respiration noted PELVIC: Normal mildly atrophic appearing external genitalia, with some thinning of skin at posterior introitus, no obvious lesions noted in area where lichen sclerosus previously diagnosed. MUSCULOSKELETAL: Normal range of motion. No edema noted.  Exam done with chaperone present.  Labs and Imaging No results found.  Assessment & Plan:  1. Lichen sclerosus et atrophicus of the vulva - Appears to be doing very well, previous lesions not visible - Reviewed importance of continuing to use cream prn for symptoms, not overuse, patient reports she uses it only as needed - Rx refill sent   Routine preventative health maintenance measures emphasized. Please refer to After Visit Summary for other counseling recommendations.   Return in about 6 months (around 07/28/2023) for annual.   K. Therese Sarah, MD, [facility] Attending Center for [facility])

## 2023-02-10 ENCOUNTER — Encounter: Payer: Self-pay | Admitting: Medical-Surgical

## 2023-03-30 LAB — HEMOGLOBIN A1C: Hemoglobin A1C: 5.2

## 2023-03-30 LAB — LIPID PANEL
Cholesterol: 205 — AB (ref 0–200)
HDL: 69 (ref 35–70)
LDL Cholesterol: 123
Triglycerides: 75 (ref 40–160)

## 2023-03-30 LAB — BASIC METABOLIC PANEL WITH GFR: Glucose: 84

## 2023-05-03 ENCOUNTER — Encounter: Payer: Self-pay | Admitting: Family Medicine

## 2023-05-03 ENCOUNTER — Ambulatory Visit: Payer: Medicare Other | Admitting: Family Medicine

## 2023-05-03 VITALS — BP 128/80 | Ht 68.0 in | Wt 188.0 lb

## 2023-05-03 DIAGNOSIS — Z0000 Encounter for general adult medical examination without abnormal findings: Secondary | ICD-10-CM | POA: Diagnosis not present

## 2023-05-03 DIAGNOSIS — Z13820 Encounter for screening for osteoporosis: Secondary | ICD-10-CM | POA: Diagnosis not present

## 2023-05-03 DIAGNOSIS — M8580 Other specified disorders of bone density and structure, unspecified site: Secondary | ICD-10-CM | POA: Diagnosis not present

## 2023-05-03 HISTORY — DX: Encounter for general adult medical examination without abnormal findings: Z00.00

## 2023-05-03 NOTE — Progress Notes (Signed)
Subjective:   Cynthia Dalton is a 67 y.o. female who presents for Medicare Annual (Subsequent) preventive examination.  Visit Complete: Virtual I connected with  Alto Denver on 05/03/23 by a audio enabled telemedicine application and verified that I am speaking with the correct person using two identifiers.  Patient Location: Other:  son's house   Provider Location: Office/Clinic  I discussed the limitations of evaluation and management by telemedicine. The patient expressed understanding and agreed to proceed.  Vital Signs: Because this visit was a virtual/telehealth visit, some criteria may be missing or patient reported. Any vitals not documented were not able to be obtained and vitals that have been documented are patient reported.  Patient Medicare AWV questionnaire was completed by the patient on 05/03/23; I have confirmed that all information answered by patient is correct and no changes since this date.  Cardiac Risk Factors include: advanced age (>27men, >49 women)     Objective:    Today's Vitals   05/03/23 0805  BP: 128/80  Weight: 188 lb (85.3 kg)  Height: 5\' 8"  (1.727 m)   Body mass index is 28.59 kg/m.     05/03/2023    8:15 AM 04/29/2022    8:04 AM 11/03/2020    1:47 PM  Advanced Directives  Does Patient Have a Medical Advance Directive? Yes Yes Yes  Type of Estate agent of [place];Living will Living will Healthcare Power of [place];Living will  Does patient want to make changes to medical advance directive? No - Patient declined No - Patient declined No - Patient declined  Copy of Healthcare Power of Attorney in Chart?   No - copy requested    Current Medications (verified) Outpatient Encounter Medications as of 05/03/2023  Medication Sig   clobetasol ointment (TEMOVATE) 0.05 % Apply to affected area 2 x weekly.   ibuprofen (ADVIL) 200 MG tablet Take 200 mg by mouth every 6 (six) hours as needed.   chlorhexidine  (PERIDEX) 0.12 % solution SMARTSIG:By Mouth (Patient not taking: Reported on 05/03/2023)   No facility-administered encounter medications on file as of 05/03/2023.    Allergies (verified) Patient has no known allergies.   History: Past Medical History:  Diagnosis Date   Encounter for Medicare annual wellness exam 05/03/2023   Past Surgical History:  Procedure Laterality Date   APPENDECTOMY  2000   TENNIS ELBOW RELEASE/NIRSCHEL PROCEDURE  2005   TONSILLECTOMY     Family History  Problem Relation Age of Onset   High blood pressure Mother    High Cholesterol Mother    Dementia Mother    Arthritis Mother    Hypertension Mother    Alzheimer's disease Father    Asthma Father    Social History   Socioeconomic History   Marital status: Married    Spouse name: Chrissie Noa   Number of children: 2   Years of education: 16   Highest education level: Bachelor's degree (e.g., BA, AB, BS)  Occupational History   Occupation: retired  Tobacco Use   Smoking status: Never   Smokeless tobacco: Never  Vaping Use   Vaping status: Never Used  Substance and Sexual Activity   Alcohol use: Yes    Alcohol/week: 8.0 standard drinks of alcohol    Types: 8 Glasses of wine per week   Drug use: Never   Sexual activity: Yes    Partners: Male    Birth control/protection: Post-menopausal, None  Other Topics Concern   Not on file  Social History Narrative  Lives with her husband. She has two children. She likes to read, cook, doing outdoor activities, and doing crossword puzzles.   Social Determinants of Health   Financial Resource Strain: Low Risk  (05/03/2023)   Overall Financial Resource Strain (CARDIA)    Difficulty of Paying Living Expenses: Not hard at all  Food Insecurity: No Food Insecurity (05/03/2023)   Hunger Vital Sign    Worried About Running Out of Food in the Last Year: Never true    Ran Out of Food in the Last Year: Never true  Transportation Needs: No Transportation Needs  (05/03/2023)   PRAPARE - Administrator, Civil Service (Medical): No    Lack of Transportation (Non-Medical): No  Physical Activity: Sufficiently Active (05/03/2023)   Exercise Vital Sign    Days of Exercise per Week: 5 days    Minutes of Exercise per Session: 40 min  Stress: No Stress Concern Present (05/03/2023)   [employer] of Occupational Health - Occupational Stress Questionnaire    Feeling of Stress : Only a little  Social Connections: Socially Integrated (05/03/2023)   Social Connection and Isolation Panel [NHANES]    Frequency of Communication with Friends and Family: More than three times a week    Frequency of Social Gatherings with Friends and Family: Twice a week    Attends Religious Services: More than 4 times per year    Active Member of [employer] or Organizations: Yes    Attends Engineer, structural: More than 4 times per year    Marital Status: Married    Tobacco Counseling Counseling given: Not Answered   Clinical Intake:  Pre-visit preparation completed: Yes  Pain : No/denies pain     BMI - recorded: 28.5 Nutritional Status: BMI 25 -29 Overweight Nutritional Risks: None Diabetes: No  How often do you need to have someone help you when you read instructions, pamphlets, or other written materials from your doctor or pharmacy?: 1 - Never What is the last grade level you completed in school?: 16  Interpreter Needed?: No      Activities of Daily Living    05/03/2023    8:07 AM 05/02/2023    7:06 PM  In your present state of health, do you have any difficulty performing the following activities:  Hearing? 0 0  Vision? 0 0  Difficulty concentrating or making decisions? 0 0  Walking or climbing stairs? 0 0  Dressing or bathing? 0 0  Doing errands, shopping? 0 0  Preparing Food and eating ? N N  Using the Toilet? N N  In the past six months, have you accidently leaked urine? N N  Do you have problems with loss of bowel  control? N N  Managing your Medications? N N  Managing your Finances? N N  Housekeeping or managing your Housekeeping? N N    Patient Care Team: Christen Butter, NP as PCP - General (Nurse Practitioner) Leroy Libman, MD, GYN   Indicate any recent Medical Services you may have received from other than [facility] providers in the past year (date may be approximate).     Assessment:   This is a routine wellness examination for [place].  Hearing/Vision screen Hearing Screening - Comments:: Unable to test, grossly intact.  Vision Screening - Comments:: Unable to test, wears reading glasses, no issues reported.   Goals Addressed             This Visit's Progress    Set My Weight Loss Goal  Follow Up Date: with PCP as needed.     - set weight loss goal: has lost 25 pounds in this past year. Would like to lose 10-15 more pounds to meet goal of 175 pounds         Depression Screen    05/03/2023    8:17 AM 05/03/2023    8:14 AM 04/29/2022    8:05 AM 03/02/2022   10:35 AM 09/13/2021    3:28 PM 08/18/2021    9:18 AM 11/03/2020    1:48 PM  PHQ 2/9 Scores  PHQ - 2 Score 0 0 0 0 0 0 0    Fall Risk    05/03/2023    8:16 AM 05/02/2023    7:06 PM 04/29/2022    8:05 AM 04/28/2022    9:45 AM 03/02/2022   10:35 AM  Fall Risk   Falls in the past year? 0 0 0 0 0  Number falls in past yr: 0 0 0  0  Injury with Fall? 0 0 0  0  Risk for fall due to : No Fall Risks  No Fall Risks  No Fall Risks  Follow up   Falls evaluation completed  Falls evaluation completed    MEDICARE RISK AT HOME: Medicare Risk at Home Any stairs in or around the home?: Yes If so, are there any without handrails?: No Home free of loose throw rugs in walkways, pet beds, electrical cords, etc?: Yes Adequate lighting in your home to reduce risk of falls?: Yes Life alert?: No Use of a cane, walker or w/c?: No Grab bars in the bathroom?: No Shower chair or bench in shower?: Yes Elevated toilet seat or a handicapped  toilet?: Yes  TIMED UP AND GO:  Was the test performed?  No    Cognitive Function:        05/03/2023    8:18 AM 04/29/2022    8:08 AM 11/03/2020    1:56 PM  6CIT Screen  What Year? 0 points 0 points 0 points  What month? 0 points 0 points 0 points  What time? 0 points 0 points 0 points  Count back from 20 0 points 0 points 0 points  Months in reverse 0 points 0 points 0 points  Repeat phrase 0 points 0 points 0 points  Total Score 0 points 0 points 0 points    Immunizations Immunization History  Administered Date(s) Administered   Fluad Quad(high Dose 65+) 03/23/2021   Influenza, High Dose Seasonal PF 03/24/2022   Influenza,inj,Quad PF,6+ Mos 04/05/2018, 04/02/2019   Influenza-Unspecified 03/24/2020   Moderna Covid-19 Vaccine Bivalent Booster 25yrs & up 03/24/2022   PFIZER(Purple Top)SARS-COV-2 Vaccination 07/15/2019, 08/11/2019, 03/13/2020, 11/02/2020   Pfizer Covid-19 Vaccine Bivalent Booster 58yrs & up 01/15/2023   Pneumococcal Conjugate-13 08/18/2021   Rsv, Mab, Nirsevimab-alip, 1 Ml, Neonate To 24 Mos(Beyfortus) 06/21/2022   Tdap 01/24/2023   Zoster Recombinant(Shingrix) 10/29/2018, 01/03/2019    TDAP status: Up to date  Flu Vaccine status: Up to date  Pneumococcal vaccine status: Due, Education has been provided regarding the importance of this vaccine. Advised may receive this vaccine at local pharmacy or Health Dept. Aware to provide a copy of the vaccination record if obtained from local pharmacy or Health Dept. Verbalized acceptance and understanding.  Covid-19 vaccine status: Completed vaccines  Qualifies for Shingles Vaccine? Yes   Zostavax completed Yes   Shingrix Completed?: Yes  Screening Tests Health Maintenance  Topic Date Due   Hepatitis C Screening  Never done  Pneumonia Vaccine 75+ Years old (2 of 2 - PPSV23 or PCV20) 08/19/2022   COVID-19 Vaccine (7 - 2023-24 season) 03/12/2023   MAMMOGRAM  03/31/2024   Medicare Annual Wellness (AWV)   05/02/2024   Colonoscopy  02/26/2031   DTaP/Tdap/Td (2 - Td or Tdap) 01/23/2033   INFLUENZA VACCINE  Completed   DEXA SCAN  Completed   Zoster Vaccines- Shingrix  Completed   HPV VACCINES  Aged Out    Health Maintenance  Health Maintenance Due  Topic Date Due   Hepatitis C Screening  Never done   Pneumonia Vaccine 82+ Years old (2 of 2 - PPSV23 or PCV20) 08/19/2022   COVID-19 Vaccine (7 - 2023-24 season) 03/12/2023    Colorectal cancer screening: Type of screening: Colonoscopy. Completed 02/25/21. Repeat every 10 years  Mammogram status: Completed 03/31/22, scheduled for 05/31/23. Repeat every year  Bone Density status: Completed 12/16/20. Results reflect: Bone density results: OSTEOPENIA. Repeat every 2 years.  Lung Cancer Screening: (Low Dose CT Chest recommended if Age 31-80 years, 20 pack-year currently smoking OR have quit w/in 15years.) does not qualify.   Lung Cancer Screening Referral: n/a   Additional Screening:  Hepatitis C Screening: does qualify; Completed not completed.   Vision Screening: Recommended annual ophthalmology exams for early detection of glaucoma and other disorders of the eye. Is the patient up to date with their annual eye exam?  Yes  Who is the provider or what is the name of the office in which the patient attends annual eye exams? [facility]  If pt is not established with a provider, would they like to be referred to a provider to establish care? No .   Dental Screening: Recommended annual dental exams for proper oral hygiene  Diabetic Foot Exam: n/a   Community Resource Referral / Chronic Care Management: CRR required this visit?  No   CCM required this visit?  No     Plan:     I have personally reviewed and noted the following in the patient's chart:   Medical and social history Use of alcohol, tobacco or illicit drugs  Current medications and supplements including opioid prescriptions. Patient is not currently taking opioid  prescriptions. Functional ability and status Nutritional status Physical activity Advanced directives List of other physicians Hospitalizations, surgeries, and ER visits in previous 12 months: no  Vitals Screenings to include cognitive, depression, and falls Referrals and appointments: Will discuss need for bone density with PCP (cost is prohibitive),  needs Hep C screening per PCP. Will get PCV 20 or 23 at local pharmacy.   In addition, I have reviewed and discussed with patient certain preventive protocols, quality metrics, and best practice recommendations. A written personalized care plan for preventive services as well as general preventive health recommendations were provided to patient.     Novella Olive, FNP   05/03/2023   After Visit Summary: (MyChart) Due to this being a telephonic visit, the after visit summary with patients personalized plan was offered to patient via MyChart   Follow-up with PCP as instructed above.

## 2023-05-15 ENCOUNTER — Other Ambulatory Visit: Payer: Self-pay | Admitting: Medical-Surgical

## 2023-05-15 DIAGNOSIS — Z1231 Encounter for screening mammogram for malignant neoplasm of breast: Secondary | ICD-10-CM

## 2023-05-31 DIAGNOSIS — Z1231 Encounter for screening mammogram for malignant neoplasm of breast: Secondary | ICD-10-CM | POA: Diagnosis not present

## 2023-10-13 ENCOUNTER — Ambulatory Visit (INDEPENDENT_AMBULATORY_CARE_PROVIDER_SITE_OTHER): Admitting: Medical-Surgical

## 2023-10-13 ENCOUNTER — Encounter: Payer: Self-pay | Admitting: Medical-Surgical

## 2023-10-13 VITALS — BP 120/74 | HR 63 | Resp 20 | Ht 68.0 in | Wt 196.0 lb

## 2023-10-13 DIAGNOSIS — Z23 Encounter for immunization: Secondary | ICD-10-CM

## 2023-10-13 DIAGNOSIS — M8580 Other specified disorders of bone density and structure, unspecified site: Secondary | ICD-10-CM

## 2023-10-13 DIAGNOSIS — Z131 Encounter for screening for diabetes mellitus: Secondary | ICD-10-CM | POA: Diagnosis not present

## 2023-10-13 DIAGNOSIS — Z1329 Encounter for screening for other suspected endocrine disorder: Secondary | ICD-10-CM | POA: Diagnosis not present

## 2023-10-13 DIAGNOSIS — Z0000 Encounter for general adult medical examination without abnormal findings: Secondary | ICD-10-CM | POA: Diagnosis not present

## 2023-10-13 DIAGNOSIS — E785 Hyperlipidemia, unspecified: Secondary | ICD-10-CM

## 2023-10-13 NOTE — Patient Instructions (Signed)
 Preventive Care 43 Years and Older, Female Preventive care refers to lifestyle choices and visits with your health care provider that can promote health and wellness. Preventive care visits are also called wellness exams. What can I expect for my preventive care visit? Counseling Your health care provider may ask you questions about your: Medical history, including: Past medical problems. Family medical history. Pregnancy and menstrual history. History of falls. Current health, including: Memory and ability to understand (cognition). Emotional well-being. Home life and relationship well-being. Sexual activity and sexual health. Lifestyle, including: Alcohol, nicotine or tobacco, and drug use. Access to firearms. Diet, exercise, and sleep habits. Work and work Astronomer. Sunscreen use. Safety issues such as seatbelt and bike helmet use. Physical exam Your health care provider will check your: Height and weight. These may be used to calculate your BMI (body mass index). BMI is a measurement that tells if you are at a healthy weight. Waist circumference. This measures the distance around your waistline. This measurement also tells if you are at a healthy weight and may help predict your risk of certain diseases, such as type 2 diabetes and high blood pressure. Heart rate and blood pressure. Body temperature. Skin for abnormal spots. What immunizations do I need?  Vaccines are usually given at various ages, according to a schedule. Your health care provider will recommend vaccines for you based on your age, medical history, and lifestyle or other factors, such as travel or where you work. What tests do I need? Screening Your health care provider may recommend screening tests for certain conditions. This may include: Lipid and cholesterol levels. Hepatitis C test. Hepatitis B test. HIV (human immunodeficiency virus) test. STI (sexually transmitted infection) testing, if you are at  risk. Lung cancer screening. Colorectal cancer screening. Diabetes screening. This is done by checking your blood sugar (glucose) after you have not eaten for a while (fasting). Mammogram. Talk with your health care provider about how often you should have regular mammograms. BRCA-related cancer screening. This may be done if you have a family history of breast, ovarian, tubal, or peritoneal cancers. Bone density scan. This is done to screen for osteoporosis. Talk with your health care provider about your test results, treatment options, and if necessary, the need for more tests. Follow these instructions at home: Eating and drinking  Eat a diet that includes fresh fruits and vegetables, whole grains, lean protein, and low-fat dairy products. Limit your intake of foods with high amounts of sugar, saturated fats, and salt. Take vitamin and mineral supplements as recommended by your health care provider. Do not drink alcohol if your health care provider tells you not to drink. If you drink alcohol: Limit how much you have to 0-1 drink a day. Know how much alcohol is in your drink. In the [place] one drink equals one 12 oz bottle of beer (355 mL), one 5 oz glass of wine (148 mL), or one 1 oz glass of hard liquor (44 mL). Lifestyle Brush your teeth every morning and night with fluoride toothpaste. Floss one time each day. Exercise for at least 30 minutes 5 or more days each week. Do not use any products that contain nicotine or tobacco. These products include cigarettes, chewing tobacco, and vaping devices, such as e-cigarettes. If you need help quitting, ask your health care provider. Do not use drugs. If you are sexually active, practice safe sex. Use a condom or other form of protection in order to prevent STIs. Take aspirin only as told by  your health care provider. Make sure that you understand how much to take and what form to take. Work with your health care provider to find out whether it  is safe and beneficial for you to take aspirin daily. Ask your health care provider if you need to take a cholesterol-lowering medicine (statin). Find healthy ways to manage stress, such as: Meditation, yoga, or listening to music. Journaling. Talking to a trusted person. Spending time with friends and family. Minimize exposure to UV radiation to reduce your risk of skin cancer. Safety Always wear your seat belt while driving or riding in a vehicle. Do not drive: If you have been drinking alcohol. Do not ride with someone who has been drinking. When you are tired or distracted. While texting. If you have been using any mind-altering substances or drugs. Wear a helmet and other protective equipment during sports activities. If you have firearms in your house, make sure you follow all gun safety procedures. What's next? Visit your health care provider once a year for an annual wellness visit. Ask your health care provider how often you should have your eyes and teeth checked. Stay up to date on all vaccines. This information is not intended to replace advice given to you by your health care provider. Make sure you discuss any questions you have with your health care provider. Document Revised: 11/25/2020 Document Reviewed: 11/25/2020 Elsevier Patient Education  2024 [employer].

## 2023-10-13 NOTE — Progress Notes (Signed)
 Complete physical exam  Patient: Cynthia Dalton   DOB: Jul 31, 1955   68 y.o. Female  MRN: [id number]  Subjective:    Chief Complaint  Patient presents with   Annual Exam    Cheila Glacken is a 68 y.o. female who presents today for a complete physical exam. She reports consuming a general diet.  Strength training and walking for exercise.  She generally feels well. She reports sleeping well. She does not have additional problems to discuss today.    Most recent fall risk assessment:    10/13/2023    8:21 AM  Fall Risk   Falls in the past year? 0  Follow up Falls evaluation completed     Most recent depression screenings:    10/13/2023    8:21 AM 05/03/2023    8:17 AM  PHQ 2/9 Scores  PHQ - 2 Score 0 0    Vision:Within last year and Dental: No current dental problems and Receives regular dental care    Patient Care Team: Cherre Cornish, NP as PCP - General (Nurse Practitioner)   Outpatient Medications Prior to Visit  Medication Sig   clobetasol  ointment (TEMOVATE ) 0.05 % Apply to affected area 2 x weekly.   ibuprofen (ADVIL) 200 MG tablet Take 200 mg by mouth every 6 (six) hours as needed.   [DISCONTINUED] chlorhexidine (PERIDEX) 0.12 % solution SMARTSIG:By Mouth (Patient not taking: Reported on 05/03/2023)   No facility-administered medications prior to visit.    Review of Systems  Constitutional:  Negative for chills, fever, malaise/fatigue and weight loss.  HENT:  Negative for congestion, ear pain, hearing loss, sinus pain and sore throat.   Eyes:  Negative for blurred vision, photophobia and pain.  Respiratory:  Negative for cough, shortness of breath and wheezing.   Cardiovascular:  Negative for chest pain, palpitations and leg swelling.  Gastrointestinal:  Negative for abdominal pain, constipation, diarrhea, heartburn, nausea and vomiting.  Genitourinary:  Negative for dysuria, frequency and urgency.  Musculoskeletal:  Negative for falls and neck pain.   Skin:  Negative for itching and rash.  Neurological:  Negative for dizziness, weakness and headaches.  Endo/Heme/Allergies:  Negative for polydipsia. Does not bruise/bleed easily.  Psychiatric/Behavioral:  Negative for depression, substance abuse and suicidal ideas. The patient is not nervous/anxious and does not have insomnia.      Objective:    BP 120/74 (BP Location: Left Arm, Cuff Size: Normal)   Pulse 63   Resp 20   Ht 5\' 8"  (1.727 m)   Wt 196 lb (88.9 kg)   SpO2 99%   BMI 29.80 kg/m    Physical Exam Vitals reviewed.  Constitutional:      General: She is not in acute distress.    Appearance: Normal appearance. She is not ill-appearing.  HENT:     Head: Normocephalic and atraumatic.     Right Ear: Tympanic membrane, ear canal and external ear normal. There is no impacted cerumen.     Left Ear: Tympanic membrane, ear canal and external ear normal. There is no impacted cerumen.     Nose: Nose normal. No congestion or rhinorrhea.     Mouth/Throat:     Mouth: Mucous membranes are moist.     Pharynx: No oropharyngeal exudate or posterior oropharyngeal erythema.  Eyes:     General: No scleral icterus.       Right eye: No discharge.        Left eye: No discharge.     Extraocular Movements: Extraocular movements  intact.     Conjunctiva/sclera: Conjunctivae normal.     Pupils: Pupils are equal, round, and reactive to light.  Neck:     Thyroid: No thyromegaly.     Vascular: No carotid bruit or JVD.     Trachea: Trachea normal.  Cardiovascular:     Rate and Rhythm: Normal rate and regular rhythm.     Pulses: Normal pulses.     Heart sounds: Normal heart sounds. No murmur heard.    No friction rub. No gallop.  Pulmonary:     Effort: Pulmonary effort is normal. No respiratory distress.     Breath sounds: Normal breath sounds. No wheezing.  Abdominal:     General: Bowel sounds are normal. There is no distension.     Palpations: Abdomen is soft.     Tenderness: There is no  abdominal tenderness. There is no guarding.  Musculoskeletal:        General: Normal range of motion.     Cervical back: Normal range of motion and neck supple.  Lymphadenopathy:     Cervical: No cervical adenopathy.  Skin:    General: Skin is warm and dry.  Neurological:     Mental Status: She is alert and oriented to person, place, and time.     Cranial Nerves: No cranial nerve deficit.  Psychiatric:        Mood and Affect: Mood normal.        Behavior: Behavior normal.        Thought Content: Thought content normal.        Judgment: Judgment normal.      No results found for any visits on 10/13/23.     Assessment & Plan:    Routine Health Maintenance and Physical Exam  Immunization History  Administered Date(s) Administered   Fluad Quad(high Dose 65+) 03/23/2021   Influenza, High Dose Seasonal PF 03/24/2022   Influenza,inj,Quad PF,6+ Mos 04/05/2018, 04/02/2019   Influenza-Unspecified 03/24/2020, 03/22/2023   Moderna Covid-19 Vaccine Bivalent Booster 82yrs & up 03/24/2022   PFIZER(Purple Top)SARS-COV-2 Vaccination 07/15/2019, 08/11/2019, 03/13/2020, 11/02/2020   Pfizer Covid-19 Vaccine Bivalent Booster 67yrs & up 01/15/2023   Pneumococcal Conjugate-13 08/18/2021   Rsv, Mab, Nirsevimab-alip, 1 Ml, Neonate To 24 Mos(Beyfortus) 06/21/2022   Tdap 01/24/2023   Zoster Recombinant(Shingrix ) 10/29/2018, 01/03/2019    Health Maintenance  Topic Date Due   Pneumonia Vaccine 67+ Years old (2 of 2 - PPSV23) 08/19/2022   COVID-19 Vaccine (7 - 2024-25 season) 10/29/2023 (Originally 03/12/2023)   Hepatitis C Screening  10/12/2024 (Originally 06/17/1973)   INFLUENZA VACCINE  01/12/2024   Medicare Annual Wellness (AWV)  05/02/2024   MAMMOGRAM  05/30/2025   Colonoscopy  02/26/2031   DTaP/Tdap/Td (2 - Td or Tdap) 01/23/2033   DEXA SCAN  Completed   Zoster Vaccines- Shingrix   Completed   HPV VACCINES  Aged Out   Meningococcal B Vaccine  Aged Out    Discussed health benefits of  physical activity, and encouraged her to engage in regular exercise appropriate for her age and condition.  1. Annual physical exam (Primary) Checking labs as below.  Up-to-date on preventative care.  Wellness information provided with AVS. - CBC with Differential/Platelet - CMP14+EGFR - Lipid panel  2. Healthcare maintenance Checking labs as below. - CBC with Differential/Platelet - CMP14+EGFR - Lipid panel  3. Dyslipidemia History of elevated cholesterol.  Checking labs today. - CMP14+EGFR - Lipid panel  4. Diabetes mellitus screening Checking hemoglobin A1c. - Hemoglobin A1c  5. Thyroid disorder screen  Checking TSH. - TSH  6. Osteopenia, unspecified location Significantly active and making sure to get calcium and vitamin D on a daily basis.  Checking CBC. - CBC with Differential/Platelet  Return in about 1 year (around 10/12/2024) for annual physical exam.     Cherre Cornish, NP

## 2023-10-14 LAB — CBC WITH DIFFERENTIAL/PLATELET
Basophils Absolute: 0.1 10*3/uL (ref 0.0–0.2)
Basos: 1 %
EOS (ABSOLUTE): 0.1 10*3/uL (ref 0.0–0.4)
Eos: 2 %
Hematocrit: 44.8 % (ref 34.0–46.6)
Hemoglobin: 15.7 g/dL (ref 11.1–15.9)
Immature Grans (Abs): 0 10*3/uL (ref 0.0–0.1)
Immature Granulocytes: 0 %
Lymphocytes Absolute: 2 10*3/uL (ref 0.7–3.1)
Lymphs: 40 %
MCH: 33.4 pg — ABNORMAL HIGH (ref 26.6–33.0)
MCHC: 35 g/dL (ref 31.5–35.7)
MCV: 95 fL (ref 79–97)
Monocytes Absolute: 0.4 10*3/uL (ref 0.1–0.9)
Monocytes: 7 %
Neutrophils Absolute: 2.4 10*3/uL (ref 1.4–7.0)
Neutrophils: 50 %
Platelets: 220 10*3/uL (ref 150–450)
RBC: 4.7 x10E6/uL (ref 3.77–5.28)
RDW: 10.4 % — ABNORMAL LOW (ref 11.7–15.4)
WBC: 4.9 10*3/uL (ref 3.4–10.8)

## 2023-10-14 LAB — CMP14+EGFR
ALT: 19 IU/L (ref 0–32)
AST: 19 IU/L (ref 0–40)
Albumin: 4.3 g/dL (ref 3.9–4.9)
Alkaline Phosphatase: 97 IU/L (ref 44–121)
BUN/Creatinine Ratio: 20 (ref 12–28)
BUN: 17 mg/dL (ref 8–27)
Bilirubin Total: 0.6 mg/dL (ref 0.0–1.2)
CO2: 23 mmol/L (ref 20–29)
Calcium: 10.4 mg/dL — ABNORMAL HIGH (ref 8.7–10.3)
Chloride: 101 mmol/L (ref 96–106)
Creatinine, Ser: 0.85 mg/dL (ref 0.57–1.00)
Globulin, Total: 2.3 g/dL (ref 1.5–4.5)
Glucose: 91 mg/dL (ref 70–99)
Potassium: 5.4 mmol/L — ABNORMAL HIGH (ref 3.5–5.2)
Sodium: 139 mmol/L (ref 134–144)
Total Protein: 6.6 g/dL (ref 6.0–8.5)
eGFR: 74 mL/min/{1.73_m2} (ref >59)

## 2023-10-14 LAB — HEMOGLOBIN A1C
Est. average glucose Bld gHb Est-mCnc: 100 mg/dL
Hgb A1c MFr Bld: 5.1 % (ref 4.8–5.6)

## 2023-10-14 LAB — LIPID PANEL
Chol/HDL Ratio: 2.8 ratio (ref 0.0–4.4)
Cholesterol, Total: 211 mg/dL — ABNORMAL HIGH (ref 100–199)
HDL: 75 mg/dL (ref >39)
LDL Chol Calc (NIH): 121 mg/dL — ABNORMAL HIGH (ref 0–99)
Triglycerides: 85 mg/dL (ref 0–149)
VLDL Cholesterol Cal: 15 mg/dL (ref 5–40)

## 2023-10-14 LAB — TSH: TSH: 1.04 u[IU]/mL (ref 0.450–4.500)

## 2023-10-16 ENCOUNTER — Encounter: Payer: Self-pay | Admitting: Medical-Surgical

## 2023-10-16 DIAGNOSIS — E785 Hyperlipidemia, unspecified: Secondary | ICD-10-CM

## 2023-10-16 MED ORDER — ATORVASTATIN CALCIUM 10 MG PO TABS: 10.0000 mg | ORAL_TABLET | Freq: Every day | ORAL | 3 refills | Status: AC

## 2023-12-14 ENCOUNTER — Ambulatory Visit: Payer: Self-pay | Admitting: Medical-Surgical

## 2023-12-14 LAB — LIPID PANEL
Chol/HDL Ratio: 2.3 ratio (ref 0.0–4.4)
Cholesterol, Total: 152 mg/dL (ref 100–199)
HDL: 65 mg/dL (ref >39)
LDL Chol Calc (NIH): 73 mg/dL (ref 0–99)
Triglycerides: 69 mg/dL (ref 0–149)
VLDL Cholesterol Cal: 14 mg/dL (ref 5–40)

## 2023-12-14 LAB — CMP14+EGFR
ALT: 16 IU/L (ref 0–32)
AST: 14 IU/L (ref 0–40)
Albumin: 4.6 g/dL (ref 3.9–4.9)
Alkaline Phosphatase: 95 IU/L (ref 44–121)
BUN/Creatinine Ratio: 19 (ref 12–28)
BUN: 16 mg/dL (ref 8–27)
Bilirubin Total: 0.7 mg/dL (ref 0.0–1.2)
CO2: 23 mmol/L (ref 20–29)
Calcium: 10.2 mg/dL (ref 8.7–10.3)
Chloride: 103 mmol/L (ref 96–106)
Creatinine, Ser: 0.84 mg/dL (ref 0.57–1.00)
Globulin, Total: 2.1 g/dL (ref 1.5–4.5)
Glucose: 75 mg/dL (ref 70–99)
Potassium: 5.2 mmol/L (ref 3.5–5.2)
Sodium: 142 mmol/L (ref 134–144)
Total Protein: 6.7 g/dL (ref 6.0–8.5)
eGFR: 75 mL/min/{1.73_m2} (ref >59)

## 2023-12-15 ENCOUNTER — Encounter: Payer: Self-pay | Admitting: Medical-Surgical

## 2024-04-24 ENCOUNTER — Other Ambulatory Visit: Payer: Self-pay | Admitting: Obstetrics and Gynecology

## 2024-04-24 DIAGNOSIS — N904 Leukoplakia of vulva: Secondary | ICD-10-CM

## 2024-05-01 ENCOUNTER — Telehealth: Payer: Self-pay | Admitting: *Deleted

## 2024-05-01 NOTE — Telephone Encounter (Signed)
 Patient needs Temovate  refill sent to pharmacy on file. E prescription message failed to the pharmacy on 04/29/2024 from Dr. Nicholaus. Annual scheduled for 06/20/2024 with [facility].

## 2024-05-06 ENCOUNTER — Telehealth: Payer: Self-pay

## 2024-05-06 DIAGNOSIS — N904 Leukoplakia of vulva: Secondary | ICD-10-CM

## 2024-05-06 MED ORDER — CLOBETASOL PROPIONATE 0.05 % EX OINT: TOPICAL_OINTMENT | CUTANEOUS | 0 refills | Status: DC

## 2024-05-06 NOTE — Telephone Encounter (Signed)
 RN received message from front office that electronic prescription for Clobetasol  did not go through. RN spoke with pharmacy and confirmed, re-sent electronically.  Silvano LELON Piano, RN

## 2024-06-18 NOTE — Progress Notes (Signed)
 "  ANNUAL EXAM Patient name: Cynthia Dalton MRN [id number]  Date of birth: 1955/12/24 Chief Complaint:   Gynecologic Exam  History of Present Illness:   Cynthia Dalton is a 69 y.o. G81P2012 female being seen today for a routine annual exam.   Current concerns: Has h/o LSC. Uses clobetasol  prn symptoms. Usually once a month. Primary symptoms on perineum.   No LMP recorded. Patient is postmenopausal.   Last MXR: 06/02/23: birad1, cat b density  Last Pap/Pap History:  H/O abnormal pap: no 01/2020: Normal pap/HPV negative  Review of Systems:   Pertinent items are noted in HPI Denies any headaches, blurred vision, fatigue, shortness of breath, chest pain, abdominal pain, abnormal vaginal discharge/itching/odor/irritation, problems with periods, bowel movements, urination, or intercourse unless otherwise stated above.  Pertinent History Reviewed:  Reviewed past medical,surgical, social and family history.  Reviewed problem list, medications and allergies. Physical Assessment:   Vitals:   06/20/24 1108  BP: 132/82  Pulse: 65  Weight: 205 lb (93 kg)  Height: 5' 8 (1.727 m)  Body mass index is 31.17 kg/m.   Physical Examination:  General appearance - well appearing, and in no distress Mental status - alert, oriented to person, place, and time Psych:  She has a normal mood and affect Skin - warm and dry, normal color, no suspicious lesions noted Chest - effort normal Heart - normal rate  Breasts - breasts appear normal, no suspicious masses, no skin or nipple changes or axillary nodes Abdomen - soft, nontender, nondistended, no masses or organomegaly Pelvic -  Performed and: VULVA: normal appearing vulva with no masses, tenderness or lesions, small area on perineum c/w lichen sclerosus but faint and overall no lesions or abnormalities.  VAGINA: normal appearing vagina with normal color and discharge, no lesions CERVIX: normal appearing cervix without discharge or lesions, no  CMT. UTERUS: Not examined ADNEXA: Not examined Extremities:  No swelling or varicosities noted  Chaperone present for exam  No results found for this or any previous visit (from the past 24 hours).  Assessment & Plan:  Cynthia Dalton was seen today for gynecologic exam.  Diagnoses and all orders for this visit:  Encounter for annual routine gynecological examination - Cervical cancer screening: Discussed guidelines related to screening beyond 65. Based on guidelines, the patient meets criteria for cessation of pap smears (two normal paps in the last 10 years and no history of CIN3 in the last 25 years).  Patient would like: to stop pap smears unless indicated. We did do pap with HPV today to have two normals on file but if normal, then she can officially discontinue.  - STI testing: declines - Breast Health: Encouraged self-breast awareness/SBE. Discussed limits of clinical breast exam for detecting breast cancer. Discussed importance of annual MXR. Rx given for MXR - Colonoscopy: ~2023 - F/U 12 months and prn - this would be primarily for checking on her [facility].  -     Cytology - PAP  Lichen sclerosus et atrophicus of the vulva -     clobetasol  ointment (TEMOVATE ) 0.05 %; Apply to affected area 2 x weekly.  Encounter for screening mammogram for malignant neoplasm of breast -     MM 3D SCREENING MAMMOGRAM BILATERAL BREAST; Future       Orders Placed This Encounter  Procedures   MM 3D SCREENING MAMMOGRAM BILATERAL BREAST    Meds:  Meds ordered this encounter  Medications   clobetasol  ointment (TEMOVATE ) 0.05 %    Sig: Apply to affected area  2 x weekly.    Dispense:  30 g    Refill:  0    Follow-up: No follow-ups on file.  Vina Solian, MD 06/20/2024 11:57 AM   "

## 2024-06-20 ENCOUNTER — Encounter: Payer: Self-pay | Admitting: Obstetrics and Gynecology

## 2024-06-20 ENCOUNTER — Other Ambulatory Visit (HOSPITAL_COMMUNITY)
Admission: RE | Admit: 2024-06-20 | Discharge: 2024-06-20 | Disposition: A | Source: Ambulatory Visit | Attending: Obstetrics and Gynecology | Admitting: Obstetrics and Gynecology

## 2024-06-20 ENCOUNTER — Ambulatory Visit: Admitting: Obstetrics and Gynecology

## 2024-06-20 VITALS — BP 132/82 | HR 65 | Ht 68.0 in | Wt 205.0 lb

## 2024-06-20 DIAGNOSIS — Z01419 Encounter for gynecological examination (general) (routine) without abnormal findings: Secondary | ICD-10-CM | POA: Insufficient documentation

## 2024-06-20 DIAGNOSIS — Z1151 Encounter for screening for human papillomavirus (HPV): Secondary | ICD-10-CM | POA: Insufficient documentation

## 2024-06-20 DIAGNOSIS — N904 Leukoplakia of vulva: Secondary | ICD-10-CM | POA: Diagnosis not present

## 2024-06-20 DIAGNOSIS — Z1231 Encounter for screening mammogram for malignant neoplasm of breast: Secondary | ICD-10-CM

## 2024-06-20 MED ORDER — CLOBETASOL PROPIONATE 0.05 % EX OINT: TOPICAL_OINTMENT | CUTANEOUS | 0 refills | Status: AC

## 2024-06-20 NOTE — Assessment & Plan Note (Signed)
 Doing well with prn clobetasol . Uses it about monthly. Primary sx on perineum.

## 2024-06-24 ENCOUNTER — Ambulatory Visit: Payer: Self-pay | Admitting: Obstetrics and Gynecology

## 2024-06-24 LAB — CYTOLOGY - PAP
Comment: NEGATIVE
Diagnosis: NEGATIVE
High risk HPV: NEGATIVE

## 2024-07-09 ENCOUNTER — Ambulatory Visit

## 2024-07-09 ENCOUNTER — Ambulatory Visit (HOSPITAL_BASED_OUTPATIENT_CLINIC_OR_DEPARTMENT_OTHER)

## 2024-07-15 ENCOUNTER — Inpatient Hospital Stay (HOSPITAL_BASED_OUTPATIENT_CLINIC_OR_DEPARTMENT_OTHER): Admission: RE | Admit: 2024-07-15 | Source: Ambulatory Visit

## 2024-07-18 ENCOUNTER — Inpatient Hospital Stay (HOSPITAL_BASED_OUTPATIENT_CLINIC_OR_DEPARTMENT_OTHER): Admission: RE | Admit: 2024-07-18 | Discharge: 2024-07-18 | Attending: Obstetrics and Gynecology

## 2024-07-18 ENCOUNTER — Encounter (HOSPITAL_BASED_OUTPATIENT_CLINIC_OR_DEPARTMENT_OTHER): Payer: Self-pay

## 2024-07-18 DIAGNOSIS — Z1231 Encounter for screening mammogram for malignant neoplasm of breast: Secondary | ICD-10-CM
